# Patient Record
Sex: Male | Born: 1970
Health system: Southern US, Community
[De-identification: ages and names within clinical notes are randomized; demographics above are authoritative.]

## PROBLEM LIST (undated history)

## (undated) DIAGNOSIS — I10 Essential (primary) hypertension: Secondary | ICD-10-CM

## (undated) DIAGNOSIS — J302 Other seasonal allergic rhinitis: Secondary | ICD-10-CM

## (undated) DIAGNOSIS — F430 Acute stress reaction: Secondary | ICD-10-CM

## (undated) DIAGNOSIS — G4700 Insomnia, unspecified: Secondary | ICD-10-CM

## (undated) DIAGNOSIS — Z8619 Personal history of other infectious and parasitic diseases: Secondary | ICD-10-CM

## (undated) HISTORY — DX: Acute stress reaction: F43.0

## (undated) HISTORY — PX: HERNIA REPAIR: SHX51

## (undated) HISTORY — DX: Personal history of other infectious and parasitic diseases: Z86.19

---

## 2006-05-29 DIAGNOSIS — Z8249 Family history of ischemic heart disease and other diseases of the circulatory system: Secondary | ICD-10-CM | POA: Insufficient documentation

## 2006-07-19 DIAGNOSIS — F430 Acute stress reaction: Secondary | ICD-10-CM | POA: Insufficient documentation

## 2012-10-16 LAB — TSH: TSH: 2.51 u[IU]/mL (ref 0.41–5.90)

## 2012-11-15 LAB — BASIC METABOLIC PANEL
BUN: 15 mg/dL (ref 4–21)
CREATININE: 1.1 mg/dL (ref 0.6–1.3)
Glucose: 91 mg/dL
Potassium: 4.4 mmol/L (ref 3.4–5.3)
Sodium: 141 mmol/L (ref 137–147)

## 2013-05-14 HISTORY — PX: WISDOM TOOTH EXTRACTION: SHX21

## 2013-06-04 ENCOUNTER — Inpatient Hospital Stay (HOSPITAL_COMMUNITY)
Admission: AD | Admit: 2013-06-04 | Discharge: 2013-06-06 | DRG: 065 | Disposition: A | Discharge status: Home / Self Care | Payer: 59 | Source: Other Acute Inpatient Hospital | Attending: Family Medicine | Admitting: Family Medicine

## 2013-06-04 ENCOUNTER — Inpatient Hospital Stay: Payer: Self-pay | Admitting: Internal Medicine

## 2013-06-04 ENCOUNTER — Inpatient Hospital Stay (HOSPITAL_COMMUNITY): Payer: 59

## 2013-06-04 ENCOUNTER — Encounter (HOSPITAL_COMMUNITY): Payer: Self-pay | Admitting: *Deleted

## 2013-06-04 DIAGNOSIS — R739 Hyperglycemia, unspecified: Secondary | ICD-10-CM | POA: Diagnosis present

## 2013-06-04 DIAGNOSIS — G4700 Insomnia, unspecified: Secondary | ICD-10-CM | POA: Diagnosis present

## 2013-06-04 DIAGNOSIS — R7309 Other abnormal glucose: Secondary | ICD-10-CM | POA: Diagnosis present

## 2013-06-04 DIAGNOSIS — Z79899 Other long term (current) drug therapy: Secondary | ICD-10-CM

## 2013-06-04 DIAGNOSIS — R279 Unspecified lack of coordination: Secondary | ICD-10-CM | POA: Diagnosis present

## 2013-06-04 DIAGNOSIS — J309 Allergic rhinitis, unspecified: Secondary | ICD-10-CM | POA: Diagnosis present

## 2013-06-04 DIAGNOSIS — I6350 Cerebral infarction due to unspecified occlusion or stenosis of unspecified cerebral artery: Principal | ICD-10-CM | POA: Diagnosis present

## 2013-06-04 DIAGNOSIS — Q2111 Secundum atrial septal defect: Secondary | ICD-10-CM

## 2013-06-04 DIAGNOSIS — I639 Cerebral infarction, unspecified: Secondary | ICD-10-CM

## 2013-06-04 DIAGNOSIS — Z8673 Personal history of transient ischemic attack (TIA), and cerebral infarction without residual deficits: Secondary | ICD-10-CM | POA: Diagnosis present

## 2013-06-04 DIAGNOSIS — Z823 Family history of stroke: Secondary | ICD-10-CM

## 2013-06-04 DIAGNOSIS — Q211 Atrial septal defect: Secondary | ICD-10-CM

## 2013-06-04 DIAGNOSIS — I10 Essential (primary) hypertension: Secondary | ICD-10-CM | POA: Diagnosis present

## 2013-06-04 HISTORY — DX: Insomnia, unspecified: G47.00

## 2013-06-04 HISTORY — DX: Essential (primary) hypertension: I10

## 2013-06-04 HISTORY — DX: Other seasonal allergic rhinitis: J30.2

## 2013-06-04 LAB — COMPREHENSIVE METABOLIC PANEL
ALK PHOS: 55 U/L
ALT: 26 U/L (ref 12–78)
ANION GAP: 6 — AB (ref 7–16)
Albumin: 4.2 g/dL (ref 3.4–5.0)
BUN: 22 mg/dL — ABNORMAL HIGH (ref 7–18)
Bilirubin,Total: 0.4 mg/dL (ref 0.2–1.0)
Calcium, Total: 8.6 mg/dL (ref 8.5–10.1)
Chloride: 105 mmol/L (ref 98–107)
Co2: 27 mmol/L (ref 21–32)
Creatinine: 1.17 mg/dL (ref 0.60–1.30)
EGFR (African American): >60
GLUCOSE: 190 mg/dL — AB (ref 65–99)
OSMOLALITY: 284 (ref 275–301)
Potassium: 3.6 mmol/L (ref 3.5–5.1)
SGOT(AST): 19 U/L (ref 15–37)
SODIUM: 138 mmol/L (ref 136–145)
TOTAL PROTEIN: 7.4 g/dL (ref 6.4–8.2)

## 2013-06-04 LAB — CBC
HCT: 44.9 % (ref 40.0–52.0)
HGB: 15.5 g/dL (ref 13.0–18.0)
MCH: 30.5 pg (ref 26.0–34.0)
MCHC: 34.6 g/dL (ref 32.0–36.0)
MCV: 88 fL (ref 80–100)
PLATELETS: 263 10*3/uL (ref 150–440)
RBC: 5.08 10*6/uL (ref 4.40–5.90)
RDW: 13.4 % (ref 11.5–14.5)
WBC: 8.8 10*3/uL (ref 3.8–10.6)

## 2013-06-04 LAB — URINALYSIS, COMPLETE
Bacteria: NONE SEEN
Bilirubin,UR: NEGATIVE
Blood: NEGATIVE
Glucose,UR: 50 mg/dL (ref 0–75)
Leukocyte Esterase: NEGATIVE
Nitrite: NEGATIVE
PH: 7 (ref 4.5–8.0)
Protein: NEGATIVE
RBC,UR: NONE SEEN /HPF (ref 0–5)
SQUAMOUS EPITHELIAL: NONE SEEN
Specific Gravity: 1.018 (ref 1.003–1.030)
WBC UR: 2 /HPF (ref 0–5)

## 2013-06-04 LAB — TROPONIN I: Troponin-I: <0.02 ng/mL

## 2013-06-04 LAB — TSH: Thyroid Stimulating Horm: 1.13 u[IU]/mL

## 2013-06-04 LAB — CK-MB: CK-MB: 1.3 ng/mL (ref 0.5–3.6)

## 2013-06-04 MED ORDER — ATORVASTATIN CALCIUM 40 MG PO TABS
40.0000 mg | ORAL_TABLET | Freq: Every day | ORAL | Status: DC
  Administered 2013-06-05: 40 mg via ORAL
  Filled 2013-06-04 (×3): qty 1

## 2013-06-04 MED ORDER — ASPIRIN 325 MG PO TABS
325.0000 mg | ORAL_TABLET | Freq: Every day | ORAL | Status: DC
  Administered 2013-06-05 – 2013-06-06 (×2): 325 mg via ORAL
  Filled 2013-06-04 (×2): qty 1

## 2013-06-04 MED ORDER — LISINOPRIL 10 MG PO TABS
10.0000 mg | ORAL_TABLET | Freq: Every day | ORAL | Status: DC
  Administered 2013-06-05: 10 mg via ORAL
  Filled 2013-06-04: qty 1

## 2013-06-04 MED ORDER — ZOLPIDEM TARTRATE 5 MG PO TABS: 10.0000 mg | ORAL_TABLET | Freq: Every evening | ORAL | Status: DC | PRN

## 2013-06-04 NOTE — H&P (Signed)
[facility] History and Physical  Preston DuvalJeffrey W Nguyen GNF:[id number] DOB: 03/08/1970 DOA: 06/04/2013  Referring physician: Dr. Allena KatzPatel [facility] PCP: No primary provider on file. Dr. Mila Merryonald Fisher  Chief Complaint: Nausea, vomiting, gait instability, abnormal balance  HPI: Preston Nguyen is a 43 y.o. male. He has hypertension controlled on lisinopril. Also has insomnia treated with Ambien at bedtime. Presumed seasonal allergic rhinitis on Allegra.  The patient had a extraction of a wisdom tooth approximately 2 weeks ago. Subsequently he was taking ibuprofen in addition to a hydrocodone-acetaminophen preparation. Overall he was recovering from the extraction in terms of his dental pain; he awoke this morning and had breakfast and said he felt good. Subsequently he developed the very acute onset of dizziness and gait instability. He was stumbling to walk and favoring his left side. He felt generalized dizziness is not described as room spinning around her inability to keep his balance. He had significant nausea and vomiting times many episodes.  In terms of the patient's other symptoms he says his bowel function and urinary function is normal. He denies chest pain or shortness of breath. His insomnia is chronic. He does report significant stress at work and questions whether that contributes to his risk of stroke.  Because of this he was evaluated at [facility] with initial concern for vertigo. OSH records reviewed and summarized: He underwent CT scan of the head which was unremarkable but to do a suspicion for intracranial pathology patient underwent MRI of the brain which did show acute cerebellar infarction. The patient has family preferred definitive neurologic assessment which [facility]  Review of Systems:  Review of Systems - History obtained from the patient's wife and the patient General ROS: positive for  -  malaise negative for - chills or fatigue Psychological ROS: negative Endocrine ROS: negative Respiratory ROS: no cough, shortness of breath, or wheezing Cardiovascular ROS: no chest pain or dyspnea on exertion Gastrointestinal ROS: no abdominal pain, change in bowel habits, or black or bloody stools Genito-Urinary ROS: no dysuria, trouble voiding, or hematuria Musculoskeletal ROS: negative Neurological ROS: positive for - dizziness, impaired coordination/balance and weakness negative for - behavioral changes, bowel and bladder control changes, memory loss, numbness/tingling, seizures, speech problems or tremors The remainder of a complete review of systems was reviewed and was negative.  Past Medical History  Diagnosis Date  . Hypertension   . Seasonal allergic rhinitis   . Insomnia    Past Surgical History  Procedure Laterality Date  . Wisdom tooth extraction  05/2013   Social History:  reports that he has never smoked. He does not have any smokeless tobacco history on file. He reports that he drinks alcohol. He reports that he does not use illicit drugs.  No Known Allergies  Family History  Problem Relation Age of Onset  . Stroke Mother     aged 6150s  . Stroke Maternal Grandmother      Prior to Admission medications   Medication Sig Start Date End Date Taking? Authorizing Provider  fexofenadine (ALLEGRA) 180 MG tablet Take by mouth daily. Dose unkn   Yes Historical Provider, MD  HYDROcodone-acetaminophen (NORCO/VICODIN) 5-325 MG per tablet Take 2 tablets by mouth every 6 (six) hours as needed for moderate pain.   Yes Historical Provider, MD  lisinopril (PRINIVIL,ZESTRIL) 10 MG tablet Take 10 mg by mouth daily.   Yes Historical Provider, MD  zolpidem (AMBIEN) 10 MG tablet Take 10 mg by mouth  at bedtime.   Yes Historical Provider, MD    Physical Exam: Filed Vitals:   06/04/13 2200  BP: 152/92  Pulse: 97  Temp: 98.4 F (36.9 C)  Resp: 18   BP 152/92  Pulse 97   Temp(Src) 98.4 F (36.9 C) (Oral)  Resp 18  Ht 6\' 1"  (1.854 m)  Wt 105.1 kg (231 lb 11.3 oz)  BMI 30.58 kg/m2  SpO2 99%  General: Alert, oriented to self, place, and time. HEENT: Gaze is conjugate.  No nasal deformities.  No nasal discharge noted.  Mucus membranes are well hydrated.  Tongue protrudes midline. Cardiovascular: Tachycardic to low 100s on exam rate with regular rhythm.  Normal S1 and S2.  No rubs or gallop sounds.  No murmur.  No jugular venous distention noted.  Pulmonary: Good aeration to the bilateral bases.  No adventitious sounds auscultated. Abdominal: Soft, without distention.  Non-tender throughout.  Bowel sounds are normoactive.   GU: Rectal exam deferred. MSK: Extremities without cyanosis and with good capillary refill.  No edema bilaterally. Skin: Without icterus, rash, or other lesions.  No significant wounds. Neuropsychiatric: Good attention.  No obvious motor deficits.  Good insight into situation and disease process.  Capable of making medical decisions. Physical Exam  Constitutional: He is oriented to person, place, and time.  Neurological: He is alert and oriented to person, place, and time. He displays no atrophy and no tremor. No cranial nerve deficit or sensory deficit. He exhibits normal muscle tone. He displays no seizure activity. Coordination and gait abnormal. GCS eye subscore is 4. GCS verbal subscore is 5. GCS motor subscore is 6.  On standing, the patient has significant difficulty maintaining balance.  He is observed to sway erratically to the left side, occasionally needing assistance to avoid falls.       Labs on Admission:  From outside hospital White count 8.8, hemoglobin 15.5, platelet 263 Sodium 138, potassium 3.6, chloride 105, CO2 27, glucose 190.   Radiological Exams on Admission: Dg Chest 2 View  06/04/2013   CLINICAL DATA:  Weakness, dizziness, vomiting. Cough. Retrocardiac opacity seen on OSH plain film.  EXAM: CHEST  2 VIEW   COMPARISON:  DG CHEST 1V PORT dated 06/04/2013  FINDINGS: The heart size and mediastinal contours are within normal limits. Both lungs are clear. The visualized skeletal structures are unremarkable.  IMPRESSION: No active cardiopulmonary disease.   Electronically Signed   By: Burman Nieves M.D.   On: 06/04/2013 23:02    EKG: Independently reviewed. Outside hospital EKG. Ventricular rate 94 normal sinus rhythm. Possible left atrial enlargement. QTC prolonged 497 ms.  Outside hospital radiology (reports, no images available) CT head negative MRI brain showing acute left cerebellar infarction and slowing of the left vertebral flow considered in the setting of acute infarction  Assessment/Plan Active Problems:   Cerebellar infarction  (hospital diagnoses bolded)  1. Acute cerebellar infarction.  Patient had MRI at the outside hospital which showed acute left cerebellar infarction and evidence of swelling of the left vertebral flow possibly in relation to the infarction. The patient has artery undergone the start of an appropriate workup including telemetry monitoring, EKG which shows sinus rhythm, carotid duplex which shows mild bilateral plaques.  The patient does have risk factors of family history of early stroke, hypertension.  Obtain neurologic consultation: Discussed the case with Dr. Thad Ranger via phone at 11:30 PM.  Continue aspirin for secondary prophylaxis started at the outside facility  Initiate high-dose statin  Telemetry monitoring  We'll obtain a TTE with bubble study  Patient may benefit from a MR angiogram but I will defer to neurology on the appropriate next imaging tests, if any.  Physical, occupational, speech therapy per stroke pathway  Attention to hypertension as below 2. Hypertension likely uncontrolled on lisinopril. Continue lisinopril for now; May need initiation of more intensive therapy if remaining hypertensive but do not want to drop the pressure overly  abruptly. 3. Hyperglycemia 190s. Possible stress response to acute illness with nausea vomiting but the patient does seem to have a family history of diabetes. We will obtain a hemoglobin A1c to evaluate for diabetes.  CBG monitoring. 4. Outside hospital opacity in the left lower lobe was likely spurious; obtain a repeat chest x-ray which was read as negative here. Patient has few respiratory symptoms which do not seem to be significant to his presentation at this time. 5. Insomnia: Allow Ambien when necessary 6. Recent dental extraction with oral pain: Allow acetaminophen hydrocodone preparation as needed  Neurology formally consulted by me on admission.  Code Status: Full  Family Communication: Discussed plan of care with wife at bedside  Disposition Plan: Admit to inpt tele with neuro c/s   Time spent: 75 minutes  Jacqulyn DuckingMichael W [facility] Pager [phone]

## 2013-06-05 ENCOUNTER — Inpatient Hospital Stay (HOSPITAL_COMMUNITY): Payer: 59

## 2013-06-05 ENCOUNTER — Encounter (HOSPITAL_COMMUNITY): Payer: Self-pay | Admitting: Radiology

## 2013-06-05 DIAGNOSIS — Z8673 Personal history of transient ischemic attack (TIA), and cerebral infarction without residual deficits: Secondary | ICD-10-CM | POA: Diagnosis present

## 2013-06-05 LAB — GLUCOSE, CAPILLARY
GLUCOSE-CAPILLARY: 162 mg/dL — AB (ref 70–99)
Glucose-Capillary: 139 mg/dL — ABNORMAL HIGH (ref 70–99)
Glucose-Capillary: 116 mg/dL — ABNORMAL HIGH (ref 70–99)
Glucose-Capillary: 115 mg/dL — ABNORMAL HIGH (ref 70–99)

## 2013-06-05 LAB — BASIC METABOLIC PANEL
BUN: 19 mg/dL (ref 6–23)
CALCIUM: 8.8 mg/dL (ref 8.4–10.5)
CO2: 21 meq/L (ref 19–32)
Chloride: 104 mEq/L (ref 96–112)
Creatinine, Ser: 1.01 mg/dL (ref 0.50–1.35)
GFR calc Af Amer: >90 mL/min (ref >90)
GFR calc non Af Amer: 89 mL/min — ABNORMAL LOW (ref >90)
GLUCOSE: 141 mg/dL — AB (ref 70–99)
Potassium: 3.7 mEq/L (ref 3.7–5.3)
SODIUM: 142 meq/L (ref 137–147)

## 2013-06-05 LAB — LIPID PANEL
CHOL/HDL RATIO: 3.7 ratio
Cholesterol: 149 mg/dL (ref 0–200)
HDL: 40 mg/dL (ref >39)
LDL Cholesterol: 78 mg/dL (ref 0–99)
TRIGLYCERIDES: 155 mg/dL — AB (ref <150)
VLDL: 31 mg/dL (ref 0–40)

## 2013-06-05 LAB — CBC
HCT: 39.3 % (ref 39.0–52.0)
HEMOGLOBIN: 13.7 g/dL (ref 13.0–17.0)
MCH: 30.4 pg (ref 26.0–34.0)
MCHC: 34.9 g/dL (ref 30.0–36.0)
MCV: 87.1 fL (ref 78.0–100.0)
Platelets: 310 10*3/uL (ref 150–400)
RBC: 4.51 MIL/uL (ref 4.22–5.81)
RDW: 12.5 % (ref 11.5–15.5)
WBC: 13.5 10*3/uL — ABNORMAL HIGH (ref 4.0–10.5)

## 2013-06-05 LAB — RPR

## 2013-06-05 LAB — HEMOGLOBIN A1C
Hgb A1c MFr Bld: 6 % — ABNORMAL HIGH (ref <5.7)
Mean Plasma Glucose: 126 mg/dL — ABNORMAL HIGH (ref <117)

## 2013-06-05 LAB — SEDIMENTATION RATE: SED RATE: 1 mm/h (ref 0–16)

## 2013-06-05 LAB — TSH: TSH: 1.55 u[IU]/mL (ref 0.350–4.500)

## 2013-06-05 MED ORDER — ENOXAPARIN SODIUM 40 MG/0.4ML ~~LOC~~ SOLN
40.0000 mg | Freq: Every day | SUBCUTANEOUS | Status: DC
  Administered 2013-06-05 – 2013-06-06 (×2): 40 mg via SUBCUTANEOUS
  Filled 2013-06-05 (×2): qty 0.4

## 2013-06-05 MED ORDER — STROKE: EARLY STAGES OF RECOVERY BOOK
Freq: Once | Status: DC
  Filled 2013-06-05: qty 1

## 2013-06-05 MED ORDER — IOHEXOL 350 MG/ML SOLN
50.0000 mL | Freq: Once | INTRAVENOUS | Status: AC | PRN
  Administered 2013-06-05: 50 mL via INTRAVENOUS

## 2013-06-05 NOTE — Progress Notes (Signed)
[facility] PROGRESS NOTE  Preston Nguyen WUJ:[id number] DOB: 07-22-1970 DOA: 06/04/2013 PCP: No primary provider on file.  Assessment/Plan:  Acute cerebellar infarction.  Patient had MRI at the outside hospital which showed acute left cerebellar infarction and evidence of swelling of the left vertebral flow possibly in relation to the infarction. The patient has artery undergone the start of an appropriate workup including telemetry monitoring, EKG which shows sinus rhythm, carotid duplex which shows mild bilateral plaques. The patient does have risk factors of family history of early stroke, hypertension. Patient was seen by neurology and plan is for TEE in am. Hypercoagulable and vasculitis panel. Continue with aspirin 325 mg po daily.  Hypertension BP is well controlled, will hold the lisinopril for permissive hypertension  Hyperglycemia Blood glucose has been elevated. Hb a1c is pending    Code Status: Full code Family Communication:  Discussed with wife at the bedside Disposition Plan: Home when stable   Consultants:  Neurology   Procedures:  Carotid duplex  TEE  Antibiotics:  None  HPI/Subjective: 43 y.o. male.  has hypertension controlled on lisinopril. The patient had a extraction of a wisdom tooth approximately 2 weeks ago. Subsequently he was taking ibuprofen in addition to a hydrocodone-acetaminophen preparation. Overall he was recovering from the extraction in terms of his dental pain; he awoke this morning and had breakfast and said he felt good. Subsequently he developed the very acute onset of dizziness and gait instability. He was stumbling to walk and favoring his left side. He felt generalized dizziness is not described as room spinning around her inability to keep his balance. He had significant nausea and vomiting times many episodes. In terms of the patient's other symptoms he says his bowel function and urinary function is normal. He denies  chest pain or shortness of breath. His insomnia is chronic. He does report significant stress at work and questions whether that contributes to his risk of stroke.  Because of this he was evaluated at [facility] with initial concern for vertigo. OSH records reviewed and summarized: He underwent CT scan of the head which was unremarkable but to do a suspicion for intracranial pathology patient underwent MRI of the brain which did show acute cerebellar infarction. The patient has family preferred definitive neurologic assessment which led him to referral to [facility] for [facility].    Today patient still feels dizzy on walking, but says that he has improved since yesterday    Objective: Filed Vitals:   06/05/13 1433  BP: 139/86  Pulse: 92  Temp: 97.7 F (36.5 C)  Resp: 18    Intake/Output Summary (Last 24 hours) at 06/05/13 1542 Last data filed at 06/05/13 1300  Gross per 24 hour  Intake    400 ml  Output      0 ml  Net    400 ml   Filed Weights   06/04/13 2200  Weight: 105.1 kg (231 lb 11.3 oz)    Exam:  Physical Exam: Head: Normocephalic, atraumatic.  Eyes: No signs of jaundice, EOMI Nose: Mucous membranes dry.  Throat: Oropharynx nonerythematous, no exudate appreciated.  Neck: supple,No deformities, masses, or tenderness noted. Lungs: Normal respiratory effort. B/L Clear to auscultation, no crackles or wheezes.  Heart: Regular RR. S1 and S2 normal  Abdomen: BS normoactive. Soft, Nondistended, non-tender.  Extremities: No pretibial edema, no erythema   Data Reviewed: Basic Metabolic Panel:  Recent Labs Lab 06/05/13 0012  NA 142  K 3.7  CL 104  CO2  21  GLUCOSE 141*  BUN 19  CREATININE 1.01  CALCIUM 8.8   Liver Function Tests: No results found for this basename: AST, ALT, ALKPHOS, BILITOT, PROT, ALBUMIN,  in the last 168 hours No results found for this basename: LIPASE, AMYLASE,  in the last 168 hours No results found for this basename:  AMMONIA,  in the last 168 hours CBC:  Recent Labs Lab 06/05/13 0012  WBC 13.5*  HGB 13.7  HCT 39.3  MCV 87.1  PLT 310   Cardiac Enzymes: No results found for this basename: CKTOTAL, CKMB, CKMBINDEX, TROPONINI,  in the last 168 hours BNP (last 3 results) No results found for this basename: PROBNP,  in the last 8760 hours CBG:  Recent Labs Lab 06/05/13 0641 06/05/13 1146  GLUCAP 139* 162*    No results found for this or any previous visit (from the past 240 hour(s)).   Studies: Ct Angio Head W/cm &/or Wo Cm  06/05/2013   CLINICAL DATA:  Gait imbalance, nausea, vomiting. Prior MRI shows acute left inferior cerebellar infarcts.  EXAM: CT ANGIOGRAPHY HEAD AND NECK  TECHNIQUE: Multidetector CT imaging of the head and neck was performed using the standard protocol during bolus administration of intravenous contrast. Multiplanar CT image reconstructions and MIPs were obtained to evaluate the vascular anatomy. Carotid stenosis measurements (when applicable) are obtained utilizing NASCET criteria, using the distal internal carotid diameter as the denominator.  CONTRAST:  50mL OMNIPAQUE IOHEXOL 350 MG/ML SOLN  COMPARISON:  Prior MRI from 06/04/2013.  FINDINGS: Focal hypodensities within the inferior aspect of the left cerebellar hemisphere again seen, compatible with previous identified acute ischemic infarcts within this region. No evidence of hemorrhagic conversion. No new large vessel territory infarct identified. No mass lesion, midline shift, or extra-axial fluid collection. No hydrocephalus.  The calvarium is intact. Orbits are normal. Paranasal sinuses and mastoid air cells are clear.  CTA HEAD FINDINGS  ANTERIOR CIRCULATION:  The petrous, cavernous, and supra clinoid segments of the internal carotid arteries are well opacified bilaterally without evidence of high-grade flow-limiting stenosis or other abnormality. Ophthalmic arteries are grossly normal. The A1 segments are well opacified and  symmetric bilaterally. Anterior communicating artery is normal. Anterior cerebral arteries are well well opacified bilaterally.  The middle cerebral arteries including the M1 segments and their distal branches are well opacified bilaterally. No high-grade proximal conclusion.  No aneurysm seen within the anterior circulation.  POSTERIOR CIRCULATION:  The left V4 segment is markedly diminutive as compared to the right but grossly patent. No definite high-grade flow-limiting stenosis. The posterior inferior cerebral arteries are opacified bilaterally. The vertebrobasilar junction is normal. The basilar artery is widely patent without evidence of high-grade stenosis or occlusion. The superior cerebellar arteries and anterior inferior cerebral arteries are well opacified bilaterally.  No aneurysm identified within the posterior circulation.  Review of the MIP images confirms the above findings.  CTA NECK FINDINGS  Visualized aortic arch is of normal caliber with normal 3 vessel morphology. No high-grade flow-limiting stenosis seen at the origin of the great vessels. Should Ebony Cargo arteries are widely patent bilaterally.  The common carotid arteries are well opacified bilaterally and of symmetric caliber. No evidence of high-grade flow-limiting stenosis or dissection. The carotid bifurcations are normal bilaterally. Minimal atherosclerotic plaque noted about the proximal left internal carotid artery. The internal carotid arteries are well opacified bilaterally without evidence of high-grade flow-limiting stenosis, occlusion, or dissection.  The external carotid arteries and their branches are within normal limits.  The vertebral  arteries arise from the subclavian arteries bilaterally. The right vertebral artery is dominant. The right vertebral artery is well opacified along its entire course without evidence of high-grade flow-limiting stenosis, occlusion, or dissection. The left vertebral artery is markedly diminutive  as compared to the right but is grossly patent to the level of the vertebrobasilar junction.  Review of the MIP images confirms the above findings.  Visualized soft tissues of the neck are within normal limits without evidence of mass lesion, adenopathy, or other acute abnormality. Multilevel degenerative changes and within the visualized spine. Visualized lung apices are clear.  IMPRESSION: CTA HEAD:  1. No significant interval change in size and distribution of ischemic infarcts involving the inferior left cerebellar hemisphere. No evidence of hemorrhagic conversion. 2. Diminutive but patent left vertebral artery to the level of the vertebrobasilar junction. The posterior inferior cerebral arteries are opacified bilaterally without evidence of occlusion or definite high-grade flow-limiting stenosis. 3. Dominant right vertebral artery. 4. No proximal branch occlusion or high-grade flow-limiting stenosis identified within the intracranial circulation. 5. No intracranial aneurysm. CTA NECK:  1. Normal CTA of the neck without evidence of high-grade flow-limiting stenosis, dissection, or occlusion. The left vertebral artery is diminutive along its entire course but grossly patent.   Electronically Signed   By: Rise MuBenjamin  McClintock M.D.   On: 06/05/2013 06:08   Dg Chest 2 View  06/04/2013   CLINICAL DATA:  Weakness, dizziness, vomiting. Cough. Retrocardiac opacity seen on OSH plain film.  EXAM: CHEST  2 VIEW  COMPARISON:  DG CHEST 1V PORT dated 06/04/2013  FINDINGS: The heart size and mediastinal contours are within normal limits. Both lungs are clear. The visualized skeletal structures are unremarkable.  IMPRESSION: No active cardiopulmonary disease.   Electronically Signed   By: Burman NievesWilliam  Stevens M.D.   On: 06/04/2013 23:02   Ct Angio Neck W/cm &/or Wo/cm  06/05/2013   CLINICAL DATA:  Gait imbalance, nausea, vomiting. Prior MRI shows acute left inferior cerebellar infarcts.  EXAM: CT ANGIOGRAPHY HEAD AND NECK   TECHNIQUE: Multidetector CT imaging of the head and neck was performed using the standard protocol during bolus administration of intravenous contrast. Multiplanar CT image reconstructions and MIPs were obtained to evaluate the vascular anatomy. Carotid stenosis measurements (when applicable) are obtained utilizing NASCET criteria, using the distal internal carotid diameter as the denominator.  CONTRAST:  50mL OMNIPAQUE IOHEXOL 350 MG/ML SOLN  COMPARISON:  Prior MRI from 06/04/2013.  FINDINGS: Focal hypodensities within the inferior aspect of the left cerebellar hemisphere again seen, compatible with previous identified acute ischemic infarcts within this region. No evidence of hemorrhagic conversion. No new large vessel territory infarct identified. No mass lesion, midline shift, or extra-axial fluid collection. No hydrocephalus.  The calvarium is intact. Orbits are normal. Paranasal sinuses and mastoid air cells are clear.  CTA HEAD FINDINGS  ANTERIOR CIRCULATION:  The petrous, cavernous, and supra clinoid segments of the internal carotid arteries are well opacified bilaterally without evidence of high-grade flow-limiting stenosis or other abnormality. Ophthalmic arteries are grossly normal. The A1 segments are well opacified and symmetric bilaterally. Anterior communicating artery is normal. Anterior cerebral arteries are well well opacified bilaterally.  The middle cerebral arteries including the M1 segments and their distal branches are well opacified bilaterally. No high-grade proximal conclusion.  No aneurysm seen within the anterior circulation.  POSTERIOR CIRCULATION:  The left V4 segment is markedly diminutive as compared to the right but grossly patent. No definite high-grade flow-limiting stenosis. The posterior inferior cerebral arteries  are opacified bilaterally. The vertebrobasilar junction is normal. The basilar artery is widely patent without evidence of high-grade stenosis or occlusion. The  superior cerebellar arteries and anterior inferior cerebral arteries are well opacified bilaterally.  No aneurysm identified within the posterior circulation.  Review of the MIP images confirms the above findings.  CTA NECK FINDINGS  Visualized aortic arch is of normal caliber with normal 3 vessel morphology. No high-grade flow-limiting stenosis seen at the origin of the great vessels. Should Ebony Cargo[place] arteries are widely patent bilaterally.  The common carotid arteries are well opacified bilaterally and of symmetric caliber. No evidence of high-grade flow-limiting stenosis or dissection. The carotid bifurcations are normal bilaterally. Minimal atherosclerotic plaque noted about the proximal left internal carotid artery. The internal carotid arteries are well opacified bilaterally without evidence of high-grade flow-limiting stenosis, occlusion, or dissection.  The external carotid arteries and their branches are within normal limits.  The vertebral arteries arise from the subclavian arteries bilaterally. The right vertebral artery is dominant. The right vertebral artery is well opacified along its entire course without evidence of high-grade flow-limiting stenosis, occlusion, or dissection. The left vertebral artery is markedly diminutive as compared to the right but is grossly patent to the level of the vertebrobasilar junction.  Review of the MIP images confirms the above findings.  Visualized soft tissues of the neck are within normal limits without evidence of mass lesion, adenopathy, or other acute abnormality. Multilevel degenerative changes and within the visualized spine. Visualized lung apices are clear.  IMPRESSION: CTA HEAD:  1. No significant interval change in size and distribution of ischemic infarcts involving the inferior left cerebellar hemisphere. No evidence of hemorrhagic conversion. 2. Diminutive but patent left vertebral artery to the level of the vertebrobasilar junction. The posterior inferior  cerebral arteries are opacified bilaterally without evidence of occlusion or definite high-grade flow-limiting stenosis. 3. Dominant right vertebral artery. 4. No proximal branch occlusion or high-grade flow-limiting stenosis identified within the intracranial circulation. 5. No intracranial aneurysm. CTA NECK:  1. Normal CTA of the neck without evidence of high-grade flow-limiting stenosis, dissection, or occlusion. The left vertebral artery is diminutive along its entire course but grossly patent.   Electronically Signed   By: Rise MuBenjamin  McClintock M.D.   On: 06/05/2013 08:17    Scheduled Meds: .  stroke: mapping our early stages of recovery book   Does not apply Once  . aspirin  325 mg Oral Daily  . atorvastatin  40 mg Oral q1800  . enoxaparin (LOVENOX) injection  40 mg Subcutaneous Daily  . lisinopril  10 mg Oral Daily   Continuous Infusions:   Principal Problem:   Cerebellar infarction Active Problems:   Essential hypertension, benign   Hyperglycemia   CVA (cerebral infarction)    Time spent: 25 min    Meredeth IdeGagan S Lama  [facility] Pager [phone] If 7PM-7AM, please contact night-coverage at [email or web address], password [email or web address] 06/05/2013, 3:42 PM  LOS: 1 day

## 2013-06-05 NOTE — Evaluation (Signed)
Physical Therapy Evaluation Patient Details Name: Preston DuvalJeffrey W Nguyen MRN: [id number] DOB: 06/15/1970 Today's Date: 06/05/2013   History of Present Illness  Admitted after sudden very acute onset of dizziness and gait instability that progressed to N/V, MRI showed L cerebellar infarct.  Clinical Impression  Pt admitted with/for dizziness and gait disturbance.  Pt currently limited functionally due to the problems listed below.  (see problems list.)  Pt will benefit from PT to maximize function and safety to be able to get home safely with limited available assist .     Follow Up Recommendations Outpatient PT    Equipment Recommendations  None recommended by PT    Recommendations for Other Services       Precautions / Restrictions Precautions Precautions: Fall      Mobility  Bed Mobility Overal bed mobility: Modified Independent (not quite normal fluidity of movement)                Transfers Overall transfer level: Needs assistance Equipment used: None Transfers: Sit to/from Stand Sit to Stand: Supervision         General transfer comment: generally fluid and normalized movement  Ambulation/Gait Ambulation/Gait assistance: Supervision Ambulation Distance (Feet): 400 Feet Assistive device: None Gait Pattern/deviations: Step-through pattern   Gait velocity interpretation: at or above normal speed for age/gender General Gait Details: generally steady with instability only at higher levels of balance challenges.  Stairs Stairs: Yes Stairs assistance: Supervision Stair Management: One rail Right;Alternating pattern;Forwards Number of Stairs: 6 General stair comments: fluid movement, but with rail assist  Wheelchair Mobility    Modified Rankin (Stroke Patients Only) Modified Rankin (Stroke Patients Only) Pre-Morbid Rankin Score: No symptoms Modified Rankin: Moderate disability     Balance Overall balance assessment: Needs assistance Sitting-balance  support: Feet supported;No upper extremity supported Sitting balance-Leahy Scale: Normal Sitting balance - Comments: determined by rhythmic stabilization   Standing balance support: No upper extremity supported Standing balance-Leahy Scale: Fair               High level balance activites: Backward walking;Direction changes;Turns;Sudden stops;Head turns High Level Balance Comments: No LOB with higher level balance challenges, but on several occasions linking one challenge to another ie walking forward turning and walking backward while scanning behind   produced some instability.             Pertinent Vitals/Pain     Home Living Family/patient expects to be discharged to:: Private residence Living Arrangements: Spouse/significant other;Other (Comment) 66(14 y/o son) Available Help at Discharge: Available PRN/intermittently;Other (Comment) (wife works as Runner, broadcasting/film/videoteacher and son at school days) Type of Home: House Home Access: Stairs to enter Entrance Stairs-Rails: Doctor, general practiceight;Left Entrance Stairs-Number of Steps: 5 (in garage) Home Layout: One level Home Equipment: None      Prior Function Level of Independence: Independent         Comments: 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)     Hand Dominance   Dominant Hand: Right    Extremity/Trunk Assessment   Upper Extremity Assessment: Defer to OT evaluation (Bil weak grips o/w strength WFL)           Lower Extremity Assessment: Overall WFL for tasks assessed (MMT did not show ataxia, dysmetria or diadodys.)      Cervical / Trunk Assessment: Normal  Communication   Communication: No difficulties  Cognition Arousal/Alertness: Awake/alert Behavior During Therapy: WFL for tasks assessed/performed Overall Cognitive Status: Within Functional Limits for tasks assessed  General Comments      Exercises        Assessment/Plan    PT Assessment Patient needs  continued PT services  PT Diagnosis  (decreased balance and coordination)   PT Problem List Decreased balance;Decreased coordination;Decreased mobility  PT Treatment Interventions Gait training;Stair training;Functional mobility training;Therapeutic activities;Patient/family education   PT Goals (Current goals can be found in the Care Plan section) Acute Rehab PT Goals Patient Stated Goal: back to work PT Goal Formulation: With patient Time For Goal Achievement: 06/12/13 Potential to Achieve Goals: Good    Frequency Min 3X/week   Barriers to discharge Decreased caregiver support;Other (comment) (Everyone works days)      Co-evaluation               End of Session   Activity Tolerance: Patient tolerated treatment well Patient left: with call bell/phone within reach;with family/visitor present (sitting EOB) Nurse Communication: Mobility status         Time: [id number] PT Time Calculation (min): 30 min   Charges:   PT Evaluation $Initial PT Evaluation Tier I: 1 Procedure PT Treatments $Gait Training: 8-22 mins $Therapeutic Activity: 8-22 mins   PT G CodesEliseo Nguyen:          Preston Nguyen 06/05/2013, 1:13 PM 06/05/2013   BingKen Damiel Nguyen, PT [phone]  (pager)

## 2013-06-05 NOTE — Care Management Note (Signed)
    Page 1 of 1   06/06/2013     3:27:18 PM CARE MANAGEMENT NOTE 06/06/2013  Patient:  Preston Nguyen, Preston Nguyen   Account Number:  [id number]  Date Initiated:  06/05/2013  Documentation initiated by:  Lorne Skeens  Subjective/Objective Assessment:   Patient admitted as transfer from [facility] for CVA. Lives at home with his wife.     Action/Plan:   Will follow for discharge needs pending PT/OT evals   Anticipated DC Date:     Anticipated DC Plan:  [place]  CM consult      Choice offered to / List presented to:             Status of service:  Completed, signed off Medicare Important Message given?   (If response is "NO", the following Medicare IM given date fields will be blank) Date Medicare IM given:   Date Additional Medicare IM given:    Discharge Disposition:    Per UR Regulation:  Reviewed for med. necessity/level of care/duration of stay  If discussed at [place] of Stay Meetings, dates discussed:    Comments:  06/06/13 [place], MSN, CM- CM was given a paper script for outpatient PT to provide to patient.  CM met with patient and wife to discuss arranging therapy at a location most convenient to their home that accepts their insurance.  Patient's wife states that she is comfortable with the process of scheduling an appointment.    06/05/13 [place], MSN, CM- Met with patient and wife regarding self-pay status and PCP.  Patient states that he DOES have a PCP: [facility]. Patient also states that he has a [employer].  Wife will bring the insurance card, which CM will fax to admitting.

## 2013-06-05 NOTE — Consult Note (Signed)
Referring Physician: Milks    Chief Complaint: N/V, gait instability  HPI: Preston Nguyen is an 43 y.o. male who reports that he awakened normal on Wednesday.  Just prior to leaving te house he had acute onset of gait imbalance. He felt as if he was falling to the left.  He went to lay down but felt no better and he then acutely developed severe nausea and vomiting.  This did not get better and EMS was called for the patient to be brought in for evaluation. Patient was initially seen at [facility]. Was sent here for further stroke evaluation.    Date last known well: Date: 06/04/2013 Time last known well: Time: 07:15 tPA Given: No: Outside time window, initially not felt to be a stroke, low NIHSS  Past Medical History  Diagnosis Date  . Hypertension   . Seasonal allergic rhinitis   . Insomnia     Past Surgical History  Procedure Laterality Date  . Wisdom tooth extraction  05/2013    Family History  Problem Relation Age of Onset  . Stroke Mother     aged 5250s  . Stroke Maternal Grandmother    Social History:  reports that he has never smoked. He does not have any smokeless tobacco history on file. He reports that he drinks alcohol. He reports that he does not use illicit drugs.  Allergies: No Known Allergies  Medications:  I have reviewed the patient's current medications. Prior to Admission:  Prescriptions prior to admission  Medication Sig Dispense Refill  . fexofenadine (ALLEGRA) 180 MG tablet Take by mouth daily. Dose unkn      . HYDROcodone-acetaminophen (NORCO/VICODIN) 5-325 MG per tablet Take 2 tablets by mouth every 6 (six) hours as needed for moderate pain.      Marland Kitchen. lisinopril (PRINIVIL,ZESTRIL) 10 MG tablet Take 10 mg by mouth daily.      Marland Kitchen. zolpidem (AMBIEN) 10 MG tablet Take 10 mg by mouth at bedtime.       Scheduled: . aspirin  325 mg Oral Daily  . atorvastatin  40 mg Oral q1800  . enoxaparin (LOVENOX) injection  40 mg Subcutaneous Q24H  . lisinopril  10 mg  Oral Daily    ROS: History obtained from the patient  General ROS: negative for - chills, fatigue, fever, night sweats, weight gain or weight loss Psychological ROS: negative for - behavioral disorder, hallucinations, memory difficulties, mood swings or suicidal ideation Ophthalmic ROS: negative for - blurry vision, double vision, eye pain or loss of vision ENT ROS: recent wisdom tooth extraction Allergy and Immunology ROS: negative for - hives or itchy/watery eyes Hematological and Lymphatic ROS: negative for - bleeding problems, bruising or swollen lymph nodes Endocrine ROS: negative for - galactorrhea, hair pattern changes, polydipsia/polyuria or temperature intolerance Respiratory ROS: negative for - cough, hemoptysis, shortness of breath or wheezing Cardiovascular ROS: negative for - chest pain, dyspnea on exertion, edema or irregular heartbeat Gastrointestinal ROS: as noted in HPI Genito-Urinary ROS: negative for - dysuria, hematuria, incontinence or urinary frequency/urgency Musculoskeletal ROS: negative for - joint swelling or muscular weakness Neurological ROS: as noted in HPI Dermatological ROS: negative for rash and skin lesion changes  Physical Examination: Blood pressure 145/83, pulse 98, temperature 97.9 F (36.6 C), temperature source Oral, resp. rate 18, height 6\' 1"  (1.854 m), weight 105.1 kg (231 lb 11.3 oz), SpO2 100.00%.  Neurologic Examination: Mental Status: Alert, oriented, thought content appropriate.  Speech fluent without evidence of aphasia.  Able to follow 3  step commands without difficulty. Cranial Nerves: II: Discs flat bilaterally; Visual fields grossly normal, pupils equal, round, reactive to light and accommodation III,IV, VI: ptosis not present, extra-ocular motions intact bilaterally V,VII: smile symmetric, facial light touch sensation normal bilaterally VIII: hearing normal bilaterally IX,X: gag reflex present XI: bilateral shoulder shrug XII:  midline tongue extension Motor: Right : Upper extremity   5/5 with pronator drift    Left:     Upper extremity   5/5  Lower extremity   5/5        Lower extremity   5/5 Tone and bulk:normal tone throughout; no atrophy noted Sensory: Pinprick and light touch intact throughout, bilaterally Deep Tendon Reflexes: 2+ and symmetric throughout Plantars: Right: downgoing   Left: downgoing Cerebellar: normal finger-to-nose and normal heel-to-shin test Gait: Unable to test safely CV: pulses palpable throughout     Laboratory Studies:  Basic Metabolic Panel: No results found for this basename: NA, K, CL, CO2, GLUCOSE, BUN, CREATININE, CALCIUM, MG, PHOS,  in the last 168 hours  Liver Function Tests: No results found for this basename: AST, ALT, ALKPHOS, BILITOT, PROT, ALBUMIN,  in the last 168 hours No results found for this basename: LIPASE, AMYLASE,  in the last 168 hours No results found for this basename: AMMONIA,  in the last 168 hours  CBC: No results found for this basename: WBC, NEUTROABS, HGB, HCT, MCV, PLT,  in the last 168 hours  Cardiac Enzymes: No results found for this basename: CKTOTAL, CKMB, CKMBINDEX, TROPONINI,  in the last 168 hours  BNP: No components found with this basename: POCBNP,   CBG: No results found for this basename: GLUCAP,  in the last 168 hours  Microbiology: No results found for this or any previous visit.  Coagulation Studies: No results found for this basename: LABPROT, INR,  in the last 72 hours  Urinalysis: No results found for this basename: COLORURINE, APPERANCEUR, LABSPEC, PHURINE, GLUCOSEU, HGBUR, BILIRUBINUR, KETONESUR, PROTEINUR, UROBILINOGEN, NITRITE, LEUKOCYTESUR,  in the last 168 hours  Lipid Panel: No results found for this basename: chol, trig, hdl, cholhdl, vldl, ldlcalc    HgbA1C:  No results found for this basename: HGBA1C    Urine Drug Screen:   No results found for this basename: labopia, cocainscrnur, labbenz, amphetmu,  thcu, labbarb    Alcohol Level: No results found for this basename: ETH,  in the last 168 hours  Other results: EKG: normal sinus rhythm at 94 bpm.  Imaging: Dg Chest 2 View  06/04/2013   CLINICAL DATA:  Weakness, dizziness, vomiting. Cough. Retrocardiac opacity seen on OSH plain film.  EXAM: CHEST  2 VIEW  COMPARISON:  DG CHEST 1V PORT dated 06/04/2013  FINDINGS: The heart size and mediastinal contours are within normal limits. Both lungs are clear. The visualized skeletal structures are unremarkable.  IMPRESSION: No active cardiopulmonary disease.   Electronically Signed   By: Burman NievesWilliam  Stevens M.D.   On: 06/04/2013 23:02    Assessment: 43 y.o. male presenting with gait imbalance and nausea/vomitng.  The patient no longer has the vomiting but continues to have difficulty with sitting unsupported and standing.  Head CT was performed at [facility] and has been reviewed.  It shows no acute changes.  With the patient not improving significantly an MRI of the brain was performed as well.  MRI of the brain from [place] reviewed and shows acute left inferior cerebellar infarcts and slow/occluded flow in the left vertebral.  Although the patient has hypertension, with the patient being so  young can not exclude further etiologies at this time.  Further work up recommended.    Stroke Risk Factors - hypertension  Plan: 1. HgbA1c, fasting lipid panel 2. CTA of the head and neck 3. PT consult, OT consult, Speech consult 4. Echocardiogram 5. Prophylactic therapy-Antiplatelet med: Aspirin - dose 325mg  daily 6. Risk factor modification 7. Telemetry monitoring 8. Frequent neuro checks   Thana Farr, MD [facility] [phone] 06/05/2013, 12:59 AM

## 2013-06-05 NOTE — Progress Notes (Signed)
Stroke Team Progress Note  HISTORY Preston Nguyen is an 43 y.o. male who reports that he awakened normal on Wednesday. Just prior to leaving te house he had acute onset of gait imbalance. He felt as if he was falling to the left. He went to lay down but felt no better and he then acutely developed severe nausea and vomiting. This did not get better and EMS was called for the patient to be brought in for evaluation. Patient was initially seen at [facility]. Was sent here for further stroke evaluation. last known well 06/04/2013 at 07:15  Patient was not administered TPA secondary to Outside time window, initially not felt to be a stroke, low NIHSS. He was admitted for further evaluation and treatment.  SUBJECTIVE His wife is at the bedside.  Overall he feels his condition is gradually improving.   OBJECTIVE Most recent Vital Signs: Filed Vitals:   06/05/13 0200 06/05/13 0400 06/05/13 0600 06/05/13 0820  BP: 147/80 142/85 148/87 139/92  Pulse: 100 99 103 93  Temp: 98 F (36.7 C) 98.4 F (36.9 C) 98 F (36.7 C) 98 F (36.7 C)  TempSrc: Oral Oral Oral Axillary  Resp: 18 18 20 18   Height:      Weight:      SpO2: 100% 100% 99% 99%   CBG (last 3)   Recent Labs  06/05/13 0641  GLUCAP 139*    IV Fluid Intake:     MEDICATIONS  .  stroke: mapping our early stages of recovery book   Does not apply Once  . aspirin  325 mg Oral Daily  . atorvastatin  40 mg Oral q1800  . enoxaparin (LOVENOX) injection  40 mg Subcutaneous Daily  . lisinopril  10 mg Oral Daily   PRN:  zolpidem  Diet:  Cardiac thin liquids Activity:   DVT Prophylaxis:  Lovenox 40 mg sq daily   CLINICALLY SIGNIFICANT STUDIES Basic Metabolic Panel:   Recent Labs Lab 06/05/13 0012  NA 142  K 3.7  CL 104  CO2 21  GLUCOSE 141*  BUN 19  CREATININE 1.01  CALCIUM 8.8   Liver Function Tests: No results found for this basename: AST, ALT, ALKPHOS, BILITOT, PROT, ALBUMIN,  in the last 168 hours CBC:   Recent  Labs Lab 06/05/13 0012  WBC 13.5*  HGB 13.7  HCT 39.3  MCV 87.1  PLT 310   Coagulation: No results found for this basename: LABPROT, INR,  in the last 168 hours Cardiac Enzymes: No results found for this basename: CKTOTAL, CKMB, CKMBINDEX, TROPONINI,  in the last 168 hours Urinalysis: No results found for this basename: COLORURINE, APPERANCEUR, LABSPEC, PHURINE, GLUCOSEU, HGBUR, BILIRUBINUR, KETONESUR, PROTEINUR, UROBILINOGEN, NITRITE, LEUKOCYTESUR,  in the last 168 hours Lipid Panel    Component Value Date/Time   CHOL 149 06/05/2013 0522   TRIG 155* 06/05/2013 0522   HDL 40 06/05/2013 0522   CHOLHDL 3.7 06/05/2013 0522   VLDL 31 06/05/2013 0522   LDLCALC 78 06/05/2013 0522   HgbA1C  No results found for this basename: HGBA1C    Urine Drug Screen:   No results found for this basename: labopia,  cocainscrnur,  labbenz,  amphetmu,  thcu,  labbarb    Alcohol Level: No results found for this basename: ETH,  in the last 168 hours   CT of the brain   CT angio of the head  06/05/2013     1. No significant interval change in size and distribution of ischemic infarcts involving the  inferior left cerebellar hemisphere. No evidence of hemorrhagic conversion. 2. Diminutive but patent left vertebral artery to the level of the vertebrobasilar junction. The posterior inferior cerebral arteries are opacified bilaterally without evidence of occlusion or definite high-grade flow-limiting stenosis. 3. Dominant right vertebral artery. 4. No proximal branch occlusion or high-grade flow-limiting stenosis identified within the intracranial circulation. 5. No intracranial aneurysm.  CT angio of the neck 06/05/2013     1. Normal CTA of the neck without evidence of high-grade flow-limiting stenosis, dissection, or occlusion. The left vertebral artery is diminutive along its entire course but grossly patent.  MRI of the brain  ([place]) acute left inferior cerebellar infarcts and slow/occluded flow in the left  vertebral.  MRA of the brain  See CT angio head  2D Echocardiogram    Carotid Doppler  ([place]) See CT angio neck  CXR  06/04/2013   No active cardiopulmonary disease.  Therapy Recommendations   Physical Exam   Pleasant young Caucasian male not in distress.Awake alert. Afebrile. Head is nontraumatic. Neck is supple without bruit. Hearing is normal. Cardiac exam no murmur or gallop. Lungs are clear to auscultation. Distal pulses are well felt. Neurological Exam ;  Awake  Alert oriented x 3. Normal speech and language.eye movements full without nystagmus.fundi were not visualized. Vision acuity and fields appear normal. Hearing is normal. Palatal movements are normal. Face symmetric. Tongue midline. Normal strength, tone, reflexes and coordination. Normal sensation. Gait deferred. ASSESSMENT [name] is a 43 y.o. male presenting with nausea, vomiting and gait instability.  Imaging confirms left cerebellar infarcts. Infarct felt to be embolic secondary to unknown source.  On no antithrombotics prior to admission. Now on aspirin 325 mg orally every day for secondary stroke prevention. Patient with resultant ataxia, nasuea and vomiting have resolved. Stroke work up underway.  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 endocarditis  Suspect obstructive sleep apnea, snores at night, takes ambien for insomnia  Hospital day # 1  TREATMENT/PLAN  Continue aspirin 325 mg orally every day for secondary stroke prevention.  Hypercoagulable and vasculitic panel  Blood cultures x 2  F/u 2D, HgbA1c TEE to look for embolic source. Arranged with [facility] for tomorrow.  If positive for PFO (patent  foramen ovale), check bilateral lower extremity venous dopplers to rule out DVT as possible source of stroke. (I have made patient NPO after midnight tonight). OP sleep study Therapy evals  Dr. Pearlean BrownieSethi discussed diagnosis, prognosis,  treatment options and plan of care with patient, wife and Dr. Sharl MaLama.   Annie MainSharon Biby, MSN, RN, ANVP-BC, AGPCNP-BC [facility] Pager: [phone] 06/05/2013 9:52 AM  I have personally obtained a history, examined the patient, evaluated imaging results, and formulated the assessment and plan of care. I agree with the above. Delia HeadyPramod Sethi, MD   To contact Stroke Continuity provider, please refer to [email or web address]. After hours, contact General Neurology

## 2013-06-05 NOTE — Evaluation (Addendum)
Speech Language Pathology Evaluation Patient Details Name: Preston DuvalJeffrey W Nguyen MRN: [id number] DOB: 07/03/1970 Today's Date: 06/05/2013 Time: [id number] SLP Time Calculation (min): 31 min  Problem List:  Patient Active Problem List   Diagnosis Date Noted  . Cerebellar infarction 06/04/2013  . Essential hypertension, benign 06/04/2013  . Hyperglycemia 06/04/2013   Past Medical History:  Past Medical History  Diagnosis Date  . Hypertension   . Seasonal allergic rhinitis   . Insomnia    Past Surgical History:  Past Surgical History  Procedure Laterality Date  . Wisdom tooth extraction  05/2013   HPI:  43 yo male transferred from [place] with gait disturbance and vomiting.  Pt with h/o HTN, recent dental surgery, allergies.  He was found to have left cerebellar CVA.  Speech/languge evaluation ordered.   Assessment / Plan / Recommendation Clinical Impression  Pt presents with functional speech, language and cognitive skills.  Expressive and receptive language and speech are clear and fluent. Pt is able to follow multiple step commands without difficulties.  He demonstrated complex problem solving/reasoning on written task and recalled 4 words within 13 minutes without cues. Pt stated all of his home medications without deficits.  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  No SLP indicated at this time, thanks for this referral.      SLP Assessment  Patient does not need any further Speech Lanaguage Pathology Services    Follow Up Recommendations  None    Frequency and Duration   n/a     Pertinent Vitals/Pain Afebrile, clear    SLP Evaluation Prior Functioning  Cognitive/Linguistic Baseline: Within functional limits Type of Home: House  Lives With: Spouse Education: MBA- spouse works as Event organiserschool teacher Vocation: Full time employment   Cognition  Overall Cognitive Status: Within  Functional Limits for tasks assessed Arousal/Alertness: Awake/alert Orientation Level: Oriented X4 Attention: Focused;Sustained;Selective Focused Attention: Appears intact Sustained Attention: Appears intact Selective Attention: Appears intact Memory: Appears intact (pt able to recall 4 words within 15 minutes without cues) Awareness: Appears intact Problem Solving: Appears intact Safety/Judgment: Appears intact    Comprehension  Auditory Comprehension Overall Auditory Comprehension: Appears within functional limits for tasks assessed Yes/No Questions: Not tested Commands: Within Functional Limits Multistep Basic Commands: 75-100% accurate Complex Commands: 75-100% accurate Conversation: Complex Visual Recognition/Discrimination Discrimination: Within Function Limits Reading Comprehension Reading Status: Within funtional limits    Expression Expression Primary Mode of Expression: Verbal Verbal Expression Overall Verbal Expression: Appears within functional limits for tasks assessed Initiation: No impairment Level of Generative/Spontaneous Verbalization: Conversation Naming: Not tested Pragmatics: No impairment Written Expression Dominant Hand: Right Written Expression: Within Functional Limits   Oral / Motor Oral Motor/Sensory Function Overall Oral Motor/Sensory Function: Appears within functional limits for tasks assessed Motor Speech Overall Motor Speech: Appears within functional limits for tasks assessed Respiration: Within functional limits Phonation: Normal Resonance: Within functional limits Articulation: Within functional limitis Intelligibility: Intelligible Motor Planning: Witnin functional limits Motor Speech Errors: Not applicable   GO     Donavan Burnetamara Atia Haupt, MS [facility] SLP [phone]

## 2013-06-06 ENCOUNTER — Encounter (HOSPITAL_COMMUNITY): Payer: Self-pay | Admitting: *Deleted

## 2013-06-06 ENCOUNTER — Encounter (HOSPITAL_COMMUNITY)
Admission: AD | Disposition: A | Discharge status: Home / Self Care | Payer: Self-pay | Source: Other Acute Inpatient Hospital | Attending: Family Medicine

## 2013-06-06 DIAGNOSIS — I6789 Other cerebrovascular disease: Secondary | ICD-10-CM

## 2013-06-06 DIAGNOSIS — I6350 Cerebral infarction due to unspecified occlusion or stenosis of unspecified cerebral artery: Secondary | ICD-10-CM

## 2013-06-06 DIAGNOSIS — Q2111 Secundum atrial septal defect: Secondary | ICD-10-CM

## 2013-06-06 DIAGNOSIS — Q211 Atrial septal defect: Secondary | ICD-10-CM

## 2013-06-06 HISTORY — PX: TEE WITHOUT CARDIOVERSION: SHX5443

## 2013-06-06 LAB — GLUCOSE, CAPILLARY
GLUCOSE-CAPILLARY: 129 mg/dL — AB (ref 70–99)
Glucose-Capillary: 152 mg/dL — ABNORMAL HIGH (ref 70–99)

## 2013-06-06 LAB — LUPUS ANTICOAGULANT PANEL
DRVVT: 39 secs (ref <42.9)
Lupus Anticoagulant: NOT DETECTED
PTT Lupus Anticoagulant: 37.3 secs (ref 28.0–43.0)

## 2013-06-06 LAB — CARDIOLIPIN ANTIBODIES, IGG, IGM, IGA
Anticardiolipin IgA: 6 APL U/mL — ABNORMAL LOW (ref <22)
Anticardiolipin IgG: 6 GPL U/mL — ABNORMAL LOW (ref <23)
Anticardiolipin IgM: 3 MPL U/mL — ABNORMAL LOW (ref <11)

## 2013-06-06 LAB — ANA: Anti Nuclear Antibody(ANA): NEGATIVE

## 2013-06-06 LAB — HIV ANTIBODY (ROUTINE TESTING W REFLEX): HIV 1&2 Ab, 4th Generation: NONREACTIVE

## 2013-06-06 SURGERY — ECHOCARDIOGRAM, TRANSESOPHAGEAL
Anesthesia: Moderate Sedation

## 2013-06-06 MED ORDER — FENTANYL CITRATE 0.05 MG/ML IJ SOLN
INTRAMUSCULAR | Status: AC
  Filled 2013-06-06: qty 2

## 2013-06-06 MED ORDER — ATORVASTATIN CALCIUM 40 MG PO TABS: 40.0000 mg | ORAL_TABLET | Freq: Every day | ORAL | Status: DC

## 2013-06-06 MED ORDER — MIDAZOLAM HCL 5 MG/ML IJ SOLN
INTRAMUSCULAR | Status: AC
  Filled 2013-06-06: qty 2

## 2013-06-06 MED ORDER — BUTAMBEN-TETRACAINE-BENZOCAINE 2-2-14 % EX AERO
INHALATION_SPRAY | CUTANEOUS | Status: DC | PRN
  Administered 2013-06-06: 2 via TOPICAL

## 2013-06-06 MED ORDER — LIDOCAINE VISCOUS 2 % MT SOLN
OROMUCOSAL | Status: AC
  Filled 2013-06-06: qty 15

## 2013-06-06 MED ORDER — MIDAZOLAM HCL 10 MG/2ML IJ SOLN
INTRAMUSCULAR | Status: DC | PRN
  Administered 2013-06-06: 2 mg via INTRAVENOUS
  Administered 2013-06-06: 1 mg via INTRAVENOUS
  Administered 2013-06-06: 2 mg via INTRAVENOUS
  Administered 2013-06-06: 1 mg via INTRAVENOUS

## 2013-06-06 MED ORDER — DIPHENHYDRAMINE HCL 50 MG/ML IJ SOLN
INTRAMUSCULAR | Status: AC
  Filled 2013-06-06: qty 1

## 2013-06-06 MED ORDER — ASPIRIN 325 MG PO TABS: 325.0000 mg | ORAL_TABLET | Freq: Every day | ORAL | Status: DC

## 2013-06-06 MED ORDER — LIDOCAINE VISCOUS 2 % MT SOLN
OROMUCOSAL | Status: DC | PRN
  Administered 2013-06-06: 20 mL via OROMUCOSAL

## 2013-06-06 MED ORDER — FENTANYL CITRATE 0.05 MG/ML IJ SOLN
INTRAMUSCULAR | Status: DC | PRN
  Administered 2013-06-06 (×2): 25 ug via INTRAVENOUS

## 2013-06-06 NOTE — Progress Notes (Signed)
VASCULAR LAB PRELIMINARY  PRELIMINARY  PRELIMINARY  PRELIMINARY  Bilateral lower extremity venous duplex  completed.    Preliminary report:  Bilateral:  No evidence of DVT, superficial thrombosis, or Baker's Cyst.    Gara KronerHelene F Edie Vallandingham, RVT 06/06/2013, 11:34 AM

## 2013-06-06 NOTE — Progress Notes (Signed)
  Echocardiogram 2D Echocardiogram has been performed.  Preston Nguyen 06/06/2013, 2:48 PM

## 2013-06-06 NOTE — Progress Notes (Signed)
Stroke Team Progress Note  HISTORY Preston Nguyen is an 43 y.o. male who reports that he awakened normal on Wednesday. Just prior to leaving te house he had acute onset of gait imbalance. He felt as if he was falling to the left. He went to lay down but felt no better and he then acutely developed severe nausea and vomiting. This did not get better and EMS was called for the patient to be brought in for evaluation. Patient was initially seen at [facility]. Was sent here for further stroke evaluation. last known well 06/04/2013 at 07:15  Patient was not administered TPA secondary to Outside time window, initially not felt to be a stroke, low NIHSS. He was admitted for further evaluation and treatment.  SUBJECTIVE Just back from TEE. Walking better . Wants to go home. No complaints  OBJECTIVE Most recent Vital Signs: Filed Vitals:   06/05/13 2103 06/06/13 0228 06/06/13 0543 06/06/13 0719  BP: 153/89 124/77 113/75 142/90  Pulse: 90 76 70 80  Temp: 97.9 F (36.6 C) 98 F (36.7 C) 97.8 F (36.6 C) 97.8 F (36.6 C)  TempSrc: Oral Oral Oral Oral  Resp: '19 18 20 13  ' Height:      Weight:      SpO2: 99% 100% 100% 98%   CBG (last 3)   Recent Labs  06/05/13 1703 06/05/13 2120 06/06/13 0625  GLUCAP 116* 115* 129*    IV Fluid Intake:     MEDICATIONS  .  stroke: mapping our early stages of recovery book   Does not apply Once  . aspirin  325 mg Oral Daily  . atorvastatin  40 mg Oral q1800  . enoxaparin (LOVENOX) injection  40 mg Subcutaneous Daily   PRN:  zolpidem  Diet:  NPO thin liquids Activity:   DVT Prophylaxis:  Lovenox 40 mg sq daily   CLINICALLY SIGNIFICANT STUDIES Basic Metabolic Panel:   Recent Labs Lab 06/05/13 0012  NA 142  K 3.7  CL 104  CO2 21  GLUCOSE 141*  BUN 19  CREATININE 1.01  CALCIUM 8.8   Liver Function Tests: No results found for this basename: AST, ALT, ALKPHOS, BILITOT, PROT, ALBUMIN,  in the last 168 hours CBC:   Recent Labs Lab  06/05/13 0012  WBC 13.5*  HGB 13.7  HCT 39.3  MCV 87.1  PLT 310   Coagulation: No results found for this basename: LABPROT, INR,  in the last 168 hours Cardiac Enzymes: No results found for this basename: CKTOTAL, CKMB, CKMBINDEX, TROPONINI,  in the last 168 hours Urinalysis: No results found for this basename: COLORURINE, APPERANCEUR, LABSPEC, PHURINE, GLUCOSEU, HGBUR, BILIRUBINUR, KETONESUR, PROTEINUR, UROBILINOGEN, NITRITE, LEUKOCYTESUR,  in the last 168 hours Lipid Panel    Component Value Date/Time   CHOL 149 06/05/2013 0522   TRIG 155* 06/05/2013 0522   HDL 40 06/05/2013 0522   CHOLHDL 3.7 06/05/2013 0522   VLDL 31 06/05/2013 0522   LDLCALC 78 06/05/2013 0522   HgbA1C  Lab Results  Component Value Date   HGBA1C 6.0* 06/05/2013    Urine Drug Screen:   No results found for this basename: labopia,  cocainscrnur,  labbenz,  amphetmu,  thcu,  labbarb    Alcohol Level: No results found for this basename: ETH,  in the last 168 hours  Ischemic Hypercoagulable & Vasculitic Workup Normal - RPR, ESR, HIV Pending - C3, C4, CH50, ANA, lupus anticoagulant, cardiolipin antibody   CT of the brain   CT angio of the head  06/05/2013     1. No significant interval change in size and distribution of ischemic infarcts involving the inferior left cerebellar hemisphere. No evidence of hemorrhagic conversion. 2. Diminutive but patent left vertebral artery to the level of the vertebrobasilar junction. The posterior inferior cerebral arteries are opacified bilaterally without evidence of occlusion or definite high-grade flow-limiting stenosis. 3. Dominant right vertebral artery. 4. No proximal branch occlusion or high-grade flow-limiting stenosis identified within the intracranial circulation. 5. No intracranial aneurysm.  CT angio of the neck 06/05/2013     1. Normal CTA of the neck without evidence of high-grade flow-limiting stenosis, dissection, or occlusion. The left vertebral artery is diminutive  along its entire course but grossly patent.  MRI of the brain  ([facility]) acute left inferior cerebellar infarcts and slow/occluded flow in the left vertebral.  MRA of the brain  See CT angio head  2D Echocardiogram  Results pending  Carotid Doppler  ([place]) No evidence of hemodynamically significant internal carotid artery stenosis. Vertebral artery flow is antegrade.   CXR  06/04/2013   No active cardiopulmonary disease. Blood cultures 06/05/13 prelim results no growth TEE no clot/vegetation, small PFO Therapy Recommendations no PT needs  Physical Exam   Pleasant young Caucasian male not in distress.Awake alert. Afebrile. Head is nontraumatic. Neck is supple without bruit. Hearing is normal. Cardiac exam no murmur or gallop. Lungs are clear to auscultation. Distal pulses are well felt. Neurological Exam ;  Awake  Alert oriented x 3. Normal speech and language.eye movements full without nystagmus.fundi were not visualized. Vision acuity and fields appear normal. Hearing is normal. Palatal movements are normal. Face symmetric. Tongue midline. Normal strength, tone, reflexes and coordination. Normal sensation. Gait deferred.  ASSESSMENT [name] is a 43 y.o. male presenting with nausea, vomiting and gait instability.  Imaging confirms left cerebellar infarcts. Infarct felt to be embolic secondary to unknown source.  On no antithrombotics prior to admission. Now on aspirin 325 mg orally every day for secondary stroke prevention. Patient with resultant ataxia, nausea and vomiting have resolved. Stroke work up underway.  hypertension High levels of stress from work, works as a Engineer, building services for Teacher, English as a foreign language in [employer]  LDL 78, started on lipitor 40 mg daily  Family hx stroke (mother in 91s, maternal grandmother)  Lekuocytosis, wbc 13.5  No temperature  Wisdom tooth extraction 05/2013, had abx prophylaxis  ? endocarditis  Suspect obstructive sleep apnea, snores at  night, takes ambien for insomnia  Hyperglycemia, HgbA1c 6.0  Hospital day # 2  TREATMENT/PLAN  Continue aspirin 325 mg orally every day for secondary stroke prevention.  F/u Hypercoagulable and vasculitic panel  F/u Blood cultures x 2  F/u 2D results  OP sleep study to be arranged at [place] center at [facility] for discharge following  LE venous dopplers. If positive for DVT change aspirin to xarelto F/U Dr Leonie Man for outpatient TCD Bubble study/emboli monitoring in 2 weeks. Stay out of work till that visit D/w Wife, patienta nd Dr Higinio Plan, MD   To contact Stroke Continuity provider, please refer to [email or web address] After hours, contact General Neurology

## 2013-06-06 NOTE — CV Procedure (Signed)
      PROCEDURE NOTE  Procedure:  Transesophageal echocardiogram Operator:  Armanda Magicraci Turner, MD Indications:  CVA  IV Meds: Versed 6mg , Fentanyl 50mcg IV Complications:None  Results: Normal LV size and function Normal RV size and function Normal RA Normal LA and LAA Normal TV with trivial TR Normal MV with trivial MR Normal trileaflet AV  Normal PV with trivial PR Normal ascending and thoracic aorta There appears to be a PFO by colorflow doppler.  By agitated saline contrast there was evidence of flow of bubble into the LA after cough only.  Given findings of colorflow and delayed bubbles in LA with cough would assume patient has a PFO.  Patient tolerated procedure well

## 2013-06-06 NOTE — Progress Notes (Signed)
  Echocardiogram 2D Echocardiogram has been performed.  Preston Nguyen 06/06/2013, 8:46 AM

## 2013-06-06 NOTE — Discharge Instructions (Signed)
Follow with Dr Pearlean BrownieSethi for TCD Bubble study/emboli monitoring in 2-3 weeks. Call [phone] to schedule

## 2013-06-06 NOTE — Progress Notes (Signed)
Discharge orders received, pt for discharge home today,  IV D/C,  D/C instructions and Rx given with verbalized understanding.  Family at bedside to assist pt with discharge. Staff brought pt downstairs via wheelchair.  

## 2013-06-06 NOTE — Interval H&P Note (Signed)
History and Physical Interval Note:  06/06/2013 8:04 AM  Preston Nguyen  has presented today for surgery, with the diagnosis of STROKE  The various methods of treatment have been discussed with the patient and family. After consideration of risks, benefits and other options for treatment, the patient has consented to  Procedure(s): TRANSESOPHAGEAL ECHOCARDIOGRAM (TEE) (N/A) as a surgical intervention .  The patient's history has been reviewed, patient examined, no change in status, stable for surgery.  I have reviewed the patient's chart and labs.  Questions were answered to the patient's satisfaction.     Quintella Reichertraci R Kasson Lamere

## 2013-06-06 NOTE — Progress Notes (Signed)
Physical Therapy Treatment Patient Details Name: Preston Nguyen MRN: [id number] DOB: Nov 02, 1970 Today's Date: 06/06/2013    History of Present Illness Admitted after sudden very acute onset of dizziness and gait instability that progressed to N/V, MRI showed L cerebellar infarct.    PT Comments    Has met goals.  Looks close to baseline.  Still would benefit from balance Lab at outpatient  Follow Up Recommendations  Outpatient PT;Other (comment) (Balance Lab in [place] or [place])     Equipment Recommendations  None recommended by PT    Recommendations for Other Services       Precautions / Restrictions Precautions Precautions: Fall    Mobility  Bed Mobility Overal bed mobility: Modified Independent                Transfers Overall transfer level: Needs assistance Equipment used: None Transfers: Sit to/from Stand Sit to Stand: Supervision;Modified independent (Device/Increase time)         General transfer comment: generally fluid and normalized movement  Ambulation/Gait Ambulation/Gait assistance: Independent (in home-like environment) Ambulation Distance (Feet): 700 Feet Assistive device: None Gait Pattern/deviations: WFL(Within Functional Limits)     General Gait Details: steady and safe in a home-like environment   Stairs Stairs: Yes Stairs assistance: Modified independent (Device/Increase time)   Number of Stairs: 12 General stair comments: fluid and safe with rail  Wheelchair Mobility    Modified Rankin (Stroke Patients Only) Modified Rankin (Stroke Patients Only) Pre-Morbid Rankin Score: No symptoms Modified Rankin: Slight disability     Balance Overall balance assessment: Needs assistance Sitting-balance support: Feet supported Sitting balance-Leahy Scale: Normal     Standing balance support: No upper extremity supported Standing balance-Leahy Scale: Normal               High level balance activites: Side  stepping;Backward walking;Direction changes;Turns;Sudden stops;Head turns;Other (comment) (picking up objects "on the fly") High Level Balance Comments: No discernible LOB or deviations    Cognition Arousal/Alertness: Awake/alert Behavior During Therapy: WFL for tasks assessed/performed Overall Cognitive Status: Within Functional Limits for tasks assessed                      Exercises      General Comments        Pertinent Vitals/Pain     Home Living                      Prior Function            PT Goals (current goals can now be found in the care plan section) Acute Rehab PT Goals Patient Stated Goal: back to work PT Goal Formulation: With patient Time For Goal Achievement: 06/12/13 Potential to Achieve Goals: Good Progress towards PT goals: Progressing toward goals    Frequency       PT Plan Current plan remains appropriate    Co-evaluation             End of Session   Activity Tolerance: Patient tolerated treatment well Patient left: with family/visitor present     Time: 7673-4193 PT Time Calculation (min): 18 min  Charges:  $Gait Training: 8-22 mins                    G CodesTessie Fass Dujuan Stankowski 06/06/2013, 12:04 PM 06/06/2013  Donnella Sham, PT [phone]  (pager)

## 2013-06-06 NOTE — Discharge Summary (Addendum)
Physician Discharge Summary  Lonna DuvalJeffrey W Oberman ZOX:[id number] DOB: 02/19/1970 DOA: 06/04/2013  PCP: No primary provider on file.  Admit date: 06/04/2013 Discharge date: 06/06/2013  Time spent: *50 minutes  Recommendations for Outpatient Follow-up:  1. *Follow up PCP in 2 weeks 2. PCP to follow the lab results for hypercoagulable and vasculitic work up   Discharge Diagnoses:  Principal Problem:   Cerebellar infarction Active Problems:   Essential hypertension, benign   Hyperglycemia   CVA (cerebral infarction)   Discharge Condition: Stable  Diet recommendation: Low fat diet  Filed Weights   06/04/13 2200  Weight: 105.1 kg (231 lb 11.3 oz)    History of present illness:  43 y.o. male.  He has hypertension controlled on lisinopril. Also has insomnia treated with Ambien at bedtime. Presumed seasonal allergic rhinitis on Allegra.  The patient had a extraction of a wisdom tooth approximately 2 weeks ago. Subsequently he was taking ibuprofen in addition to a hydrocodone-acetaminophen preparation. Overall he was recovering from the extraction in terms of his dental pain; he awoke this morning and had breakfast and said he felt good. Subsequently he developed the very acute onset of dizziness and gait instability. He was stumbling to walk and favoring his left side. He felt generalized dizziness is not described as room spinning around her inability to keep his balance. He had significant nausea and vomiting times many episodes. In terms of the patient's other symptoms he says his bowel function and urinary function is normal. He denies chest pain or shortness of breath. His insomnia is chronic. He does report significant stress at work and questions whether that contributes to his risk of stroke.  Because of this he was evaluated [facility] with initial concern for vertigo. OSH records reviewed and summarized: He underwent CT scan of the head which was unremarkable but to do a  suspicion for intracranial pathology patient underwent MRI of the brain which did show acute cerebellar infarction. The patient has family preferred definitive neurologic assessment which [facility]   Hospital Course:  Acute cerebellar infarction.  Patient had MRI at the outside hospital which showed acute left cerebellar infarction and evidence of swelling of the left vertebral flow possibly in relation to the infarction. The patient has artery undergone the start of an appropriate workup including telemetry monitoring, EKG which shows sinus rhythm, carotid duplex which shows mild bilateral plaques. The patient does have risk factors of family history of early stroke, hypertension. Patient was seen by neurology TEE was recommended, TEE is negative for thrombus but showed small PFO. Hypercoagulable and vasculitis panel is negative so far, but the final result is pending. Continue with aspirin 325 mg po daily.   Hypertension  BP is well controlled, will hcontinue with lisinopril  Hyperglycemia  Blood glucose has been elevated.  Hb a1c is 6.0    Procedures  TEE, carotid duplex  Consultations:  Neurology  Discharge Exam: Filed Vitals:   06/06/13 1502  BP: 144/91  Pulse: 98  Temp: 98.1 F (36.7 C)  Resp: 20    General: Appear in no acute distress Cardiovascular: S1`s2 RRR Respiratory: Clear bilaterally  Discharge Instructions You were cared for by a hospitalist during your hospital stay. If you have any questions about your discharge medications or the care you received while you were in the hospital after you are discharged, you can call the unit and asked to speak with the hospitalist on call if the hospitalist  that took care of you is not available. Once you are discharged, your primary care physician will handle any further medical issues. Please note that NO REFILLS for any discharge medications will be authorized once you are  discharged, as it is imperative that you return to your primary care physician (or establish a relationship with a primary care physician if you do not have one) for your aftercare needs so that they can reassess your need for medications and monitor your lab values.  Discharge Orders   Future Orders Complete By Expires   Diet - low sodium heart healthy  As directed    Discharge instructions  As directed    Increase activity slowly  As directed        Medication List         aspirin 325 MG tablet  Take 1 tablet (325 mg total) by mouth daily.     atorvastatin 40 MG tablet  Commonly known as:  LIPITOR  Take 1 tablet (40 mg total) by mouth daily at 6 PM.     fexofenadine 180 MG tablet  Commonly known as:  ALLEGRA  Take 180 mg by mouth daily as needed for allergies. Dose unkn     HYDROcodone-acetaminophen 5-325 MG per tablet  Commonly known as:  NORCO/VICODIN  Take 2 tablets by mouth every 6 (six) hours as needed for moderate pain.     lisinopril 10 MG tablet  Commonly known as:  PRINIVIL,ZESTRIL  Take 10 mg by mouth daily.     multivitamin with minerals Tabs tablet  Take 1 tablet by mouth daily.     zolpidem 10 MG tablet  Commonly known as:  AMBIEN  Take 10 mg by mouth at bedtime.       No Known Allergies    The results of significant diagnostics from this hospitalization (including imaging, microbiology, ancillary and laboratory) are listed below for reference.    Significant Diagnostic Studies: Ct Angio Head W/cm &/or Wo Cm  06/05/2013   CLINICAL DATA:  Gait imbalance, nausea, vomiting. Prior MRI shows acute left inferior cerebellar infarcts.  EXAM: CT ANGIOGRAPHY HEAD AND NECK  TECHNIQUE: Multidetector CT imaging of the head and neck was performed using the standard protocol during bolus administration of intravenous contrast. Multiplanar CT image reconstructions and MIPs were obtained to evaluate the vascular anatomy. Carotid stenosis measurements (when applicable)  are obtained utilizing NASCET criteria, using the distal internal carotid diameter as the denominator.  CONTRAST:  50mL OMNIPAQUE IOHEXOL 350 MG/ML SOLN  COMPARISON:  Prior MRI from 06/04/2013.  FINDINGS: Focal hypodensities within the inferior aspect of the left cerebellar hemisphere again seen, compatible with previous identified acute ischemic infarcts within this region. No evidence of hemorrhagic conversion. No new large vessel territory infarct identified. No mass lesion, midline shift, or extra-axial fluid collection. No hydrocephalus.  The calvarium is intact. Orbits are normal. Paranasal sinuses and mastoid air cells are clear.  CTA HEAD FINDINGS  ANTERIOR CIRCULATION:  The petrous, cavernous, and supra clinoid segments of the internal carotid arteries are well opacified bilaterally without evidence of high-grade flow-limiting stenosis or other abnormality. Ophthalmic arteries are grossly normal. The A1 segments are well opacified and symmetric bilaterally. Anterior communicating artery is normal. Anterior cerebral arteries are well well opacified bilaterally.  The middle cerebral arteries including the M1 segments and their distal branches are well opacified bilaterally. No high-grade proximal conclusion.  No aneurysm seen within the anterior circulation.  POSTERIOR CIRCULATION:  The left V4 segment is markedly  diminutive as compared to the right but grossly patent. No definite high-grade flow-limiting stenosis. The posterior inferior cerebral arteries are opacified bilaterally. The vertebrobasilar junction is normal. The basilar artery is widely patent without evidence of high-grade stenosis or occlusion. The superior cerebellar arteries and anterior inferior cerebral arteries are well opacified bilaterally.  No aneurysm identified within the posterior circulation.  Review of the MIP images confirms the above findings.  CTA NECK FINDINGS  Visualized aortic arch is of normal caliber with normal 3 vessel  morphology. No high-grade flow-limiting stenosis seen at the origin of the great vessels. Should Eb[place] Cargo arteries are widely patent bilaterally.  The common carotid arteries are well opacified bilaterally and of symmetric caliber. No evidence of high-grade flow-limiting stenosis or dissection. The carotid bifurcations are normal bilaterally. Minimal atherosclerotic plaque noted about the proximal left internal carotid artery. The internal carotid arteries are well opacified bilaterally without evidence of high-grade flow-limiting stenosis, occlusion, or dissection.  The external carotid arteries and their branches are within normal limits.  The vertebral arteries arise from the subclavian arteries bilaterally. The right vertebral artery is dominant. The right vertebral artery is well opacified along its entire course without evidence of high-grade flow-limiting stenosis, occlusion, or dissection. The left vertebral artery is markedly diminutive as compared to the right but is grossly patent to the level of the vertebrobasilar junction.  Review of the MIP images confirms the above findings.  Visualized soft tissues of the neck are within normal limits without evidence of mass lesion, adenopathy, or other acute abnormality. Multilevel degenerative changes and within the visualized spine. Visualized lung apices are clear.  IMPRESSION: CTA HEAD:  1. No significant interval change in size and distribution of ischemic infarcts involving the inferior left cerebellar hemisphere. No evidence of hemorrhagic conversion. 2. Diminutive but patent left vertebral artery to the level of the vertebrobasilar junction. The posterior inferior cerebral arteries are opacified bilaterally without evidence of occlusion or definite high-grade flow-limiting stenosis. 3. Dominant right vertebral artery. 4. No proximal branch occlusion or high-grade flow-limiting stenosis identified within the intracranial circulation. 5. No intracranial  aneurysm. CTA NECK:  1. Normal CTA of the neck without evidence of high-grade flow-limiting stenosis, dissection, or occlusion. The left vertebral artery is diminutive along its entire course but grossly patent.   Electronically Signed   By: Rise Mu M.D.   On: 06/05/2013 06:08   Dg Chest 2 View  06/04/2013   CLINICAL DATA:  Weakness, dizziness, vomiting. Cough. Retrocardiac opacity seen on OSH plain film.  EXAM: CHEST  2 VIEW  COMPARISON:  DG CHEST 1V PORT dated 06/04/2013  FINDINGS: The heart size and mediastinal contours are within normal limits. Both lungs are clear. The visualized skeletal structures are unremarkable.  IMPRESSION: No active cardiopulmonary disease.   Electronically Signed   By: Burman Nieves M.D.   On: 06/04/2013 23:02   Ct Angio Neck W/cm &/or Wo/cm  06/05/2013   CLINICAL DATA:  Gait imbalance, nausea, vomiting. Prior MRI shows acute left inferior cerebellar infarcts.  EXAM: CT ANGIOGRAPHY HEAD AND NECK  TECHNIQUE: Multidetector CT imaging of the head and neck was performed using the standard protocol during bolus administration of intravenous contrast. Multiplanar CT image reconstructions and MIPs were obtained to evaluate the vascular anatomy. Carotid stenosis measurements (when applicable) are obtained utilizing NASCET criteria, using the distal internal carotid diameter as the denominator.  CONTRAST:  50mL OMNIPAQUE IOHEXOL 350 MG/ML SOLN  COMPARISON:  Prior MRI from 06/04/2013.  FINDINGS: Focal hypodensities within  the inferior aspect of the left cerebellar hemisphere again seen, compatible with previous identified acute ischemic infarcts within this region. No evidence of hemorrhagic conversion. No new large vessel territory infarct identified. No mass lesion, midline shift, or extra-axial fluid collection. No hydrocephalus.  The calvarium is intact. Orbits are normal. Paranasal sinuses and mastoid air cells are clear.  CTA HEAD FINDINGS  ANTERIOR CIRCULATION:  The  petrous, cavernous, and supra clinoid segments of the internal carotid arteries are well opacified bilaterally without evidence of high-grade flow-limiting stenosis or other abnormality. Ophthalmic arteries are grossly normal. The A1 segments are well opacified and symmetric bilaterally. Anterior communicating artery is normal. Anterior cerebral arteries are well well opacified bilaterally.  The middle cerebral arteries including the M1 segments and their distal branches are well opacified bilaterally. No high-grade proximal conclusion.  No aneurysm seen within the anterior circulation.  POSTERIOR CIRCULATION:  The left V4 segment is markedly diminutive as compared to the right but grossly patent. No definite high-grade flow-limiting stenosis. The posterior inferior cerebral arteries are opacified bilaterally. The vertebrobasilar junction is normal. The basilar artery is widely patent without evidence of high-grade stenosis or occlusion. The superior cerebellar arteries and anterior inferior cerebral arteries are well opacified bilaterally.  No aneurysm identified within the posterior circulation.  Review of the MIP images confirms the above findings.  CTA NECK FINDINGS  Visualized aortic arch is of normal caliber with normal 3 vessel morphology. No high-grade flow-limiting stenosis seen at the origin of the great vessels. Should Eb[place] Cargo arteries are widely patent bilaterally.  The common carotid arteries are well opacified bilaterally and of symmetric caliber. No evidence of high-grade flow-limiting stenosis or dissection. The carotid bifurcations are normal bilaterally. Minimal atherosclerotic plaque noted about the proximal left internal carotid artery. The internal carotid arteries are well opacified bilaterally without evidence of high-grade flow-limiting stenosis, occlusion, or dissection.  The external carotid arteries and their branches are within normal limits.  The vertebral arteries arise from the  subclavian arteries bilaterally. The right vertebral artery is dominant. The right vertebral artery is well opacified along its entire course without evidence of high-grade flow-limiting stenosis, occlusion, or dissection. The left vertebral artery is markedly diminutive as compared to the right but is grossly patent to the level of the vertebrobasilar junction.  Review of the MIP images confirms the above findings.  Visualized soft tissues of the neck are within normal limits without evidence of mass lesion, adenopathy, or other acute abnormality. Multilevel degenerative changes and within the visualized spine. Visualized lung apices are clear.  IMPRESSION: CTA HEAD:  1. No significant interval change in size and distribution of ischemic infarcts involving the inferior left cerebellar hemisphere. No evidence of hemorrhagic conversion. 2. Diminutive but patent left vertebral artery to the level of the vertebrobasilar junction. The posterior inferior cerebral arteries are opacified bilaterally without evidence of occlusion or definite high-grade flow-limiting stenosis. 3. Dominant right vertebral artery. 4. No proximal branch occlusion or high-grade flow-limiting stenosis identified within the intracranial circulation. 5. No intracranial aneurysm. CTA NECK:  1. Normal CTA of the neck without evidence of high-grade flow-limiting stenosis, dissection, or occlusion. The left vertebral artery is diminutive along its entire course but grossly patent.   Electronically Signed   By: Rise Mu M.D.   On: 06/05/2013 08:17    Microbiology: Recent Results (from the past 240 hour(s))  CULTURE, BLOOD (ROUTINE X 2)     Status: None   Collection Time    06/05/13 11:15 AM  Result Value Ref Range Status   Specimen Description BLOOD RIGHT ANTECUBITAL   Final   Special Requests BOTTLES DRAWN AEROBIC AND ANAEROBIC 10CC   Final   Culture  Setup Time     Final   Value: 06/05/2013 16:33     Performed at [facility]   Culture     Final   Value:        BLOOD CULTURE RECEIVED NO GROWTH TO DATE CULTURE WILL BE HELD FOR 5 DAYS BEFORE ISSUING A FINAL NEGATIVE REPORT     Performed at [facility]   Report Status PENDING   Incomplete  CULTURE, BLOOD (ROUTINE X 2)     Status: None   Collection Time    06/05/13 11:30 AM      Result Value Ref Range Status   Specimen Description BLOOD RIGHT ANTECUBITAL   Final   Special Requests BOTTLES DRAWN AEROBIC ONLY 3CC   Final   Culture  Setup Time     Final   Value: 06/05/2013 16:33     Performed at [facility]   Culture     Final   Value:        BLOOD CULTURE RECEIVED NO GROWTH TO DATE CULTURE WILL BE HELD FOR 5 DAYS BEFORE ISSUING A FINAL NEGATIVE REPORT     Performed at [facility]   Report Status PENDING   Incomplete     Labs: Basic Metabolic Panel:  Recent Labs Lab 06/05/13 0012  NA 142  K 3.7  CL 104  CO2 21  GLUCOSE 141*  BUN 19  CREATININE 1.01  CALCIUM 8.8   Liver Function Tests: No results found for this basename: AST, ALT, ALKPHOS, BILITOT, PROT, ALBUMIN,  in the last 168 hours No results found for this basename: LIPASE, AMYLASE,  in the last 168 hours No results found for this basename: AMMONIA,  in the last 168 hours CBC:  Recent Labs Lab 06/05/13 0012  WBC 13.5*  HGB 13.7  HCT 39.3  MCV 87.1  PLT 310   Cardiac Enzymes: No results found for this basename: CKTOTAL, CKMB, CKMBINDEX, TROPONINI,  in the last 168 hours BNP: BNP (last 3 results) No results found for this basename: PROBNP,  in the last 8760 hours CBG:  Recent Labs Lab 06/05/13 1146 06/05/13 1703 06/05/13 2120 06/06/13 0625 06/06/13 1120  GLUCAP 162* 116* 115* 129* 152*       Signed:  Gagan S Lama  [facility] 06/06/2013, 3:17 PM

## 2013-06-06 NOTE — Progress Notes (Signed)
OT Cancellation Note  Patient Details Name: Preston Nguyen MRN: [id number] DOB: 09/18/1970   Cancelled Treatment:    Reason Eval/Treat Not Completed: OT screened, no needs identified, will sign off - Observed pt working with PT.  Pt denies vision deficits or coordination deficits.  No OT needs.  PT will address balance.   Boykin ReaperWendi M Junie Engram De SmetWendi Belladonna Lubinski, OTR/L [phone] 06/06/2013, 3:02 PM

## 2013-06-07 LAB — C3 COMPLEMENT: C3 Complement: 126 mg/dL (ref 90–180)

## 2013-06-07 LAB — C4 COMPLEMENT: Complement C4, Body Fluid: 19 mg/dL (ref 10–40)

## 2013-06-09 ENCOUNTER — Encounter (HOSPITAL_COMMUNITY): Payer: Self-pay | Admitting: Cardiology

## 2013-06-09 LAB — COMPLEMENT, TOTAL: COMPL TOTAL (CH50): 55 U/mL (ref 31–60)

## 2013-06-10 ENCOUNTER — Other Ambulatory Visit: Payer: Self-pay | Admitting: Neurology

## 2013-06-10 ENCOUNTER — Encounter: Payer: Self-pay | Admitting: Neurology

## 2013-06-10 ENCOUNTER — Telehealth: Payer: Self-pay | Admitting: Neurology

## 2013-06-10 DIAGNOSIS — I6350 Cerebral infarction due to unspecified occlusion or stenosis of unspecified cerebral artery: Secondary | ICD-10-CM

## 2013-06-10 NOTE — Telephone Encounter (Signed)
Pt calling wanting to make an appt to have bubble study done. Base on progress note from the hosp on 06/06/13, per Dr. Pennie RushingSeth, pt needed to call to have bubble study done in 2 weeks. Please advise

## 2013-06-10 NOTE — Telephone Encounter (Signed)
Patient called to state that he was recently discharged from the hospital and was told to call back and schedule a follow up in 2 weeks and a bubble study, please return call to patient and advise.

## 2013-06-11 LAB — CULTURE, BLOOD (ROUTINE X 2)
Culture: NO GROWTH
Culture: NO GROWTH

## 2013-06-13 ENCOUNTER — Telehealth: Payer: Self-pay | Admitting: *Deleted

## 2013-06-13 ENCOUNTER — Encounter: Payer: Self-pay | Admitting: Neurology

## 2013-06-16 ENCOUNTER — Other Ambulatory Visit: Payer: Self-pay | Admitting: Neurology

## 2013-06-16 DIAGNOSIS — G4733 Obstructive sleep apnea (adult) (pediatric): Secondary | ICD-10-CM

## 2013-06-16 DIAGNOSIS — Z0289 Encounter for other administrative examinations: Secondary | ICD-10-CM

## 2013-06-16 NOTE — Telephone Encounter (Signed)
Pt calling stating that he was in the hosp on 06/04/13 and Dr. Pearlean BrownieSethi saw him at this time and pt stated that Dr. Pearlean BrownieSethi mentioned having a sleep study done and wanted to know if he could have sleep study done before his appt with Dr. Pearlean BrownieSethi on 07/04/13 to have a Bubble done. Please advise

## 2013-06-18 ENCOUNTER — Telehealth: Payer: Self-pay | Admitting: Neurology

## 2013-06-18 NOTE — Telephone Encounter (Signed)
Error

## 2013-06-23 ENCOUNTER — Encounter: Payer: Self-pay | Admitting: Neurology

## 2013-06-23 ENCOUNTER — Ambulatory Visit (INDEPENDENT_AMBULATORY_CARE_PROVIDER_SITE_OTHER): Payer: 59 | Admitting: Neurology

## 2013-06-23 VITALS — BP 128/75 | HR 86 | Resp 16 | Ht 72.75 in | Wt 235.0 lb

## 2013-06-23 DIAGNOSIS — G4730 Sleep apnea, unspecified: Secondary | ICD-10-CM

## 2013-06-23 DIAGNOSIS — R0683 Snoring: Secondary | ICD-10-CM

## 2013-06-23 DIAGNOSIS — I639 Cerebral infarction, unspecified: Secondary | ICD-10-CM

## 2013-06-23 DIAGNOSIS — G4700 Insomnia, unspecified: Secondary | ICD-10-CM

## 2013-06-23 DIAGNOSIS — R0609 Other forms of dyspnea: Secondary | ICD-10-CM

## 2013-06-23 DIAGNOSIS — I6350 Cerebral infarction due to unspecified occlusion or stenosis of unspecified cerebral artery: Secondary | ICD-10-CM

## 2013-06-23 DIAGNOSIS — R0989 Other specified symptoms and signs involving the circulatory and respiratory systems: Secondary | ICD-10-CM

## 2013-06-23 MED ORDER — ZALEPLON 10 MG PO CAPS: 10.0000 mg | ORAL_CAPSULE | Freq: Every evening | ORAL | Status: DC | PRN

## 2013-06-23 NOTE — Progress Notes (Signed)
[facility] SLEEP MEDICINE CLINIC   Provider:  Melvyn Novasarmen  Nadene Witherspoon, [place]  Referring Provider: Micki RileySethi, Pramod S, MD Primary Care Physician:  Clydell HakimFISHER,DONALD EUGENE, MD  Chief Complaint  Patient presents with  . New Evaluation    Room 10  . Sleep consult    HPI:  Preston DuvalJeffrey W Nguyen is a 43 y.o. 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  He  Is seen here as a referral  from Dr. Pearlean BrownieSethi for a sleep consultation.   [name] has been recently admitted to the stroke team at [facility]. He stated that on 06-04-13 he awoke feeling normal by just leaving the house she had an onset of date ataxia and imbalance he fell asleep left as he would drift off  Or fall towards his left side. He was unbalanced, had vertigo. Then he developed severe nausea and vomiting and EMS was called for the patient. He was initially seen at [facility] and was sent here for further stroke evaluation from the local [place].  An MRI confirmed that he suffered a cerebellar stroke. Until then , the patient had been very healthy and was not on regular prescription medications. His blood cell counts and electrolyte panels were normal , his cholesterol was not elevated, but triglycerides were high. He admitted to drinking 6-7 Soda cans a day. He drinks coffee in the morning. He had eaten before this blood draw .   He has been insomniac for several years, 2-3 years now taking Ambien. The patient has no trouble going to sleep, but after 2 hours would be awake without Ambien. With Ambien he sleeps 6-8 hours, mild  hang over effect.  He goes to bed by 9.30 and watches TV until 11 PM, than will go to sleep with the help of Ambien. He will have 6 hours of sleep. Wakes up with headaches. Without the need of an alarm, he rises spontaneously. Even in college , he was an early rises.  He rarely ever naps.   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   The patient has significant retrognathia, his wife witnessed him snoring and having apnea.  She sleeps in a separate bedroom. He has not been back to work since his stroke 21 days ago.     Review of Systems: Out of a complete 14 system review, the patient complains of only the following symptoms, and all other reviewed systems are negative. Hypertension, vertigo, ataxia. Snoring, fatigue ,insomnia, rhinitis. He is still dizzy ( sice the stroke)  when bending down- vertigo. He feels better when standing erect.    History   Social History  . Marital Status: Married    Spouse Name: Preston Nguyen    Number of Children: 1  . Years of Education: Masters   Occupational History  . Not on file.   Social History Main Topics  . Smoking status: Never Smoker   . Smokeless tobacco: Never Used  . Alcohol Use: Yes     Comment: Rare  . Drug Use: No  . Sexual Activity: Not on file   Other Topics Concern  . Not on file   Social History Narrative   Reports significant stress at work.   Patient is married Preston Ice(Niki) and lives at home with his wife and his son.   Patient works full-time.   Patient has a Master's degree.   Patient is right-handed.   Patient  drinks one cup of coffee daily and 6 sodas per day.    Family History  Problem Relation Age of Onset  . Stroke Mother     aged 40s  . Stroke Maternal Grandmother   . Coronary artery disease Mother     age 52s  . Diabetes Mother   . Diabetes Maternal Grandmother   . Diabetes Paternal Grandmother   . Breast cancer Paternal Grandmother   . Cancer Paternal Grandfather   . Heart Problems Maternal Grandfather     Past Medical History  Diagnosis Date  . Hypertension   . Seasonal allergic rhinitis   . Insomnia     Past Surgical History  Procedure Laterality Date  . Wisdom tooth extraction  05/2013  . Hernia repair      "when he was younger"  . Tee without cardioversion N/A 06/06/2013    Procedure: TRANSESOPHAGEAL  ECHOCARDIOGRAM (TEE);  Surgeon: Quintella Reichert, MD;  Location: [facility] ENDOSCOPY;  Service: Cardiovascular;  Laterality: N/A;    Current Outpatient Prescriptions  Medication Sig Dispense Refill  . aspirin 325 MG tablet Take 1 tablet (325 mg total) by mouth daily.  30 tablet  2  . atorvastatin (LIPITOR) 40 MG tablet Take 1 tablet (40 mg total) by mouth daily at 6 PM.  30 tablet  2  . fexofenadine (ALLEGRA) 180 MG tablet Take 180 mg by mouth daily as needed for allergies. Dose unkn      . ibuprofen (ADVIL,MOTRIN) 600 MG tablet as needed.      Marland Kitchen lisinopril-hydrochlorothiazide (PRINZIDE,ZESTORETIC) 10-12.5 MG per tablet 1 tablet daily.      . Multiple Vitamin (MULTIVITAMIN WITH MINERALS) TABS tablet Take 1 tablet by mouth daily.      Marland Kitchen zolpidem (AMBIEN) 10 MG tablet Take 10 mg by mouth at bedtime.       No current facility-administered medications for this visit.    Allergies as of 06/23/2013  . (No Known Allergies)    Vitals: BP 128/75  Pulse 86  Resp 16  Ht 6' 0.75" (1.848 m)  Wt 235 lb (106.595 kg)  BMI 31.21 kg/m2 Last Weight:  Wt Readings from Last 1 Encounters:  06/23/13 235 lb (106.595 kg)   Last Height:   Ht Readings from Last 1 Encounters:  06/23/13 6' 0.75" (1.848 m)    Physical exam:  General: The patient is awake, alert and appears not in acute distress. The patient is well groomed. Head: Normocephalic, atraumatic. Neck is supple. Mallampati 4 , neck circumference: 16.5 - retrognathia, crowded dental status.  Cardiovascular:  Regular rate and rhythm , without  murmurs or carotid bruit, and without distended neck veins. Respiratory: Lungs are clear to auscultation. Skin:  Without evidence of edema, or rash Trunk: BMI is elevated this  patient  has a normal posture.  Neurologic exam : The patient is awake and alert, oriented to place and time.  Memory subjective described as intact.  There is a normal attention span & concentration ability. Speech is fluent without  dysarthria, dysphonia or aphasia. Mood and affect are appropriate.  Cranial nerves: Pupils are equal and briskly reactive to light.  Funduscopic exam without evidence of pallor or edema. Extraocular movements  in vertical and horizontal planes intact and without nystagmus!  Visual fields by finger perimetry are intact.  Bilateral ptosis.  Hearing to finger rub intact.   Facial sensation intact to fine touch.  Facial motor strength is symmetric and tongue and uvula move midline.  Motor exam:  Normal tone ,muscle bulk and symmetric normal strength in all extremities.  While he sat with closed yes, he leaned towards the left,   Sensory:  Fine touch, pinprick and vibration were tested in all extremities. Proprioception is normal.  Coordination: Rapid alternating movements in the fingers/hands is tested and normal.  Finger-to-nose maneuver tested and normal without evidence of ataxia, dysmetria or tremor.  Gait and station: Patient walks without assistive device . Strength within normal limits. Stance is stable and normal. Steps are unfragmented.   Deep tendon reflexes: in the  upper and lower extremities are symmetric and intact. Babinski maneuver response is downgoing.   Assessment:  After physical and neurologic examination, review of laboratory studies, imaging, neurophysiology testing and pre-existing records, assessment is  1) high risk of OSA , in a young stroke patient with no metabolic or cardiac risk factors.  2) chronic insomnia over 36 month.   Plan:  Treatment plan and additional workup : 2) Start to exchange Ambien for [employer]. SPLIT study, split at 15 and score at 3% . C02 needed.   May use Ambien in the study - please ask him to bring it and take it if needed.

## 2013-06-23 NOTE — Patient Instructions (Addendum)
Sleep Apnea  Sleep apnea is a sleep disorder characterized by abnormal pauses in breathing while you sleep. When your breathing pauses, the level of oxygen in your blood decreases. This causes you to move out of deep sleep and into light sleep. As a result, your quality of sleep is poor, and the system that carries your blood throughout your body (cardiovascular system) experiences stress. If sleep apnea remains untreated, the following conditions can develop:  High blood pressure (hypertension).  Coronary artery disease.  Inability to achieve or maintain an erection (impotence).  Impairment of your thought process (cognitive dysfunction). There are three types of sleep apnea: 1. Obstructive sleep apnea Pauses in breathing during sleep because of a blocked airway. 2. Central sleep apnea Pauses in breathing during sleep because the area of the brain that controls your breathing does not send the correct signals to the muscles that control breathing. 3. Mixed sleep apnea A combination of both obstructive and central sleep apnea. RISK FACTORS The following risk factors can increase your risk of developing sleep apnea:  Being overweight.  Smoking.  Having narrow passages in your nose and throat.  Being of older age.  Being male.  Alcohol use.  Sedative and tranquilizer use.  Ethnicity. Among individuals younger than 35 years, African Americans are at increased risk of sleep apnea. SYMPTOMS   Difficulty staying asleep.  Daytime sleepiness and fatigue.  Loss of energy.  Irritability.  Loud, heavy snoring.  Morning headaches.  Trouble concentrating.  Forgetfulness.  Decreased interest in sex. DIAGNOSIS  In order to diagnose sleep apnea, your caregiver will perform a physical examination. Your caregiver may suggest that you take a home sleep test. Your caregiver may also recommend that you spend the night in a sleep lab. In the sleep lab, several monitors record  information about your heart, lungs, and brain while you sleep. Your leg and arm movements and blood oxygen level are also recorded. TREATMENT The following actions may help to resolve mild sleep apnea:  Sleeping on your side.   Using a decongestant if you have nasal congestion.   Avoiding the use of depressants, including alcohol, sedatives, and narcotics.   Losing weight and modifying your diet if you are overweight. There also are devices and treatments to help open your airway:  Oral appliances. These are custom-made mouthpieces that shift your lower jaw forward and slightly open your bite. This opens your airway.  Devices that create positive airway pressure. This positive pressure "splints" your airway open to help you breathe better during sleep. The following devices create positive airway pressure:  Continuous positive airway pressure (CPAP) device. The CPAP device creates a continuous level of air pressure with an air pump. The air is delivered to your airway through a mask while you sleep. This continuous pressure keeps your airway open.  Nasal expiratory positive airway pressure (EPAP) device. The EPAP device creates positive air pressure as you exhale. The device consists of single-use valves, which are inserted into each nostril and held in place by adhesive. The valves create very little resistance when you inhale but create much more resistance when you exhale. That increased resistance creates the positive airway pressure. This positive pressure while you exhale keeps your airway open, making it easier to breath when you inhale again.  Bilevel positive airway pressure (BPAP) device. The BPAP device is used mainly in patients with central sleep apnea. This device is similar to the CPAP device because it also uses an air pump to deliver   continuous air pressure through a mask. However, with the BPAP machine, the pressure is set at two different levels. The pressure when you  exhale is lower than the pressure when you inhale.  Surgery. Typically, surgery is only done if you cannot comply with less invasive treatments or if the less invasive treatments do not improve your condition. Surgery involves removing excess tissue in your airway to create a wider passage way. Document Released: 01/20/2002 Document Revised: 05/27/2012 Document Reviewed: 06/08/2011 ExitCare Patient Information 2014 [employer]. Insomnia Insomnia is frequent trouble falling and/or staying asleep. Insomnia can be a long term problem or a short term problem. Both are common. Insomnia can be a short term problem when the wakefulness is related to a certain stress or worry. Long term insomnia is often related to ongoing stress during waking hours and/or poor sleeping habits. Overtime, sleep deprivation itself can make the problem worse. Every little thing feels more severe because you are overtired and your ability to cope is decreased. CAUSES   Stress, anxiety, and depression.  Poor sleeping habits.  Distractions such as TV in the bedroom.  Naps close to bedtime.  Engaging in emotionally charged conversations before bed.  Technical reading before sleep.  Alcohol and other sedatives. They may make the problem worse. They can hurt normal sleep patterns and normal dream activity.  Stimulants such as caffeine for several hours prior to bedtime.  Pain syndromes and shortness of breath can cause insomnia.  Exercise late at night.  Changing time zones may cause sleeping problems (jet lag). It is sometimes helpful to have someone observe your sleeping patterns. They should look for periods of not breathing during the night (sleep apnea). They should also look to see how long those periods last. If you live alone or observers are uncertain, you can also be observed at a sleep clinic where your sleep patterns will be professionally monitored. Sleep apnea requires a checkup and treatment. Give  your caregivers your medical history. Give your caregivers observations your family has made about your sleep.  SYMPTOMS   Not feeling rested in the morning.  Anxiety and restlessness at bedtime.  Difficulty falling and staying asleep. TREATMENT   Your caregiver may prescribe treatment for an underlying medical disorders. Your caregiver can give advice or help if you are using alcohol or other drugs for self-medication. Treatment of underlying problems will usually eliminate insomnia problems.  Medications can be prescribed for short time use. They are generally not recommended for lengthy use.  Over-the-counter sleep medicines are not recommended for lengthy use. They can be habit forming.  You can promote easier sleeping by making lifestyle changes such as:  Using relaxation techniques that help with breathing and reduce muscle tension.  Exercising earlier in the day.  Changing your diet and the time of your last meal. No night time snacks.  Establish a regular time to go to bed.  Counseling can help with stressful problems and worry.  Soothing music and white noise may be helpful if there are background noises you cannot remove.  Stop tedious detailed work at least one hour before bedtime. HOME CARE INSTRUCTIONS   Keep a diary. Inform your caregiver about your progress. This includes any medication side effects. See your caregiver regularly. Take note of:  Times when you are asleep.  Times when you are awake during the night.  The quality of your sleep.  How you feel the next day. This information will help your caregiver care for you.    Get out of bed if you are still awake after 15 minutes. Read or do some quiet activity. Keep the lights down. Wait until you feel sleepy and go back to bed.  Keep regular sleeping and waking hours. Avoid naps.  Exercise regularly.  Avoid distractions at bedtime. Distractions include watching television or engaging in any intense or  detailed activity like attempting to balance the household checkbook.  Develop a bedtime ritual. Keep a familiar routine of bathing, brushing your teeth, climbing into bed at the same time each night, listening to soothing music. Routines increase the success of falling to sleep faster.  Use relaxation techniques. This can be using breathing and muscle tension release routines. It can also include visualizing peaceful scenes. You can also help control troubling or intruding thoughts by keeping your mind occupied with boring or repetitive thoughts like the old concept of counting sheep. You can make it more creative like imagining planting one beautiful flower after another in your backyard garden.  During your day, work to eliminate stress. When this is not possible use some of the previous suggestions to help reduce the anxiety that accompanies stressful situations. MAKE SURE YOU:   Understand these instructions.  Will watch your condition.  Will get help right away if you are not doing well or get worse. Document Released: 01/28/2000 Document Revised: 04/24/2011 Document Reviewed: 02/27/2007 ExitCare Patient Information 2014 [employer].  

## 2013-06-24 ENCOUNTER — Ambulatory Visit (INDEPENDENT_AMBULATORY_CARE_PROVIDER_SITE_OTHER): Payer: 59 | Admitting: Neurology

## 2013-06-24 DIAGNOSIS — I639 Cerebral infarction, unspecified: Secondary | ICD-10-CM

## 2013-06-24 DIAGNOSIS — G4733 Obstructive sleep apnea (adult) (pediatric): Secondary | ICD-10-CM

## 2013-06-27 DIAGNOSIS — Z0289 Encounter for other administrative examinations: Secondary | ICD-10-CM

## 2013-07-04 ENCOUNTER — Telehealth: Payer: Self-pay | Admitting: *Deleted

## 2013-07-04 ENCOUNTER — Encounter: Payer: Self-pay | Admitting: Neurology

## 2013-07-04 ENCOUNTER — Ambulatory Visit (INDEPENDENT_AMBULATORY_CARE_PROVIDER_SITE_OTHER): Payer: 59

## 2013-07-04 ENCOUNTER — Ambulatory Visit (INDEPENDENT_AMBULATORY_CARE_PROVIDER_SITE_OTHER): Payer: 59 | Admitting: Neurology

## 2013-07-04 ENCOUNTER — Encounter: Payer: Self-pay | Admitting: *Deleted

## 2013-07-04 VITALS — BP 130/81 | HR 107

## 2013-07-04 DIAGNOSIS — I6350 Cerebral infarction due to unspecified occlusion or stenosis of unspecified cerebral artery: Secondary | ICD-10-CM

## 2013-07-04 DIAGNOSIS — Z0289 Encounter for other administrative examinations: Secondary | ICD-10-CM

## 2013-07-04 NOTE — Telephone Encounter (Signed)
Called and left messages on both home and mobile voicemail for this patient, explained Dr. Brett Fairy and Dr. Leonie Man had met today and reviewed his sleep study information.  He does have severe sleep apnea, but only when he sleeps on his back, therefore he needs to avoid sleeping on his back at all times.  He should turn to his side and use pillows tucked behind him or the "tennis ball method" in order to avoid being flat on his back during sleep.  He did have some snoring in all positions and this may be effectively treated using a dental device or oral appliance.  I explained that this referral could be made for him at the time that all of his results are processed and he will be contacted.  He will also receive a copy of the report with all of the doctor's recommendations for him.    Asked him to call me if he has any questions or concerns or if I may be of any assistance.

## 2013-07-04 NOTE — Patient Instructions (Addendum)
I had a long discussion with the patient and his wife regarding his recent stroke, discuss results of her evaluation and hospital, secondary stroke prevention strategies and answered questions. Continue aspirin for stroke prevention and Lipitor with target LDL goal below 70 mg percent. I advised him to diet, exercise regularly and lose weight. He was given the results preliminary of his sleep study showing only mild sleep apnea and advised to sleep on his sides and see a dentist for dental implant. Return for followup in 3 months. He may return to work on 07/08/13 without restrictions

## 2013-07-04 NOTE — Progress Notes (Signed)
[facility] [address] [place]. [place] [address] [phone]       OFFICE FOLLOW-UP NOTE  [name] Date of Birth:  [birth year] Medical Record Number:  [id number]   HPI: 77 year Caucasian male seen for first office followup visit after hospital admission for stroke on 06/04/13. She presented with sudden onset of dizziness and gait ataxia. 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 Telemetry monitoring did not reveal cardiac arrhythmias. Transthoracic echo showed normal ejection fraction. TEE showed a small PFO but no other obvious cardiac source of embolism. Lower extremity venous Dopplers were negative for DVT. Anticardiolipin antibodies and lupus anticoagulant were negative. Total cholesterol was 179 but LDL was 78 mg percent. Hemoglobin A1c was borderline at 6.0. Patient did quite well and recovered fairly quickly. He was started on aspirin and Lipitor. He states his done well and has tolerated both medicines well without side effects. He feels that he gets tired easily and his golf game is not back to par. He feels he is ready to go back to work soon. He had recently sleep study done which shows mild sleep apnea  and the final results  are yet pending ROS:   14 system review of systems is positive for decreased stamina, tiredness, snoring, mild weakness and dizziness and all other systems negative PMH:  Past Medical History  Diagnosis Date  . Hypertension   . Seasonal allergic rhinitis   . Insomnia     Social History:  History   Social History  . Marital Status: Married    Spouse Name: Preston Nguyen    Number of Children: 1  . Years of Education: Masters   Occupational History  . Not on file.   Social History Main Topics  . Smoking status: Never Smoker   . Smokeless tobacco:  Never Used  . Alcohol Use: Yes     Comment: Rare  . Drug Use: No  . Sexual Activity: Not on file   Other Topics Concern  . Not on file   Social History Narrative   Reports significant stress at work.   Patient is married Preston Nguyen) and lives at home with his wife and his son.   Patient works full-time.   Patient has a Master's degree.   Patient is right-handed.   Patient drinks one cup of coffee daily and 6 sodas per day.    Medications:   Current Outpatient Prescriptions on File Prior to Visit  Medication Sig Dispense Refill  . aspirin 325 MG tablet Take 1 tablet (325 mg total) by mouth daily.  30 tablet  2  . atorvastatin (LIPITOR) 40 MG tablet Take 1 tablet (40 mg total) by mouth daily at 6 PM.  30 tablet  2  . fexofenadine (ALLEGRA) 180 MG tablet Take 180 mg by mouth daily as needed for allergies. Dose unkn      . ibuprofen (ADVIL,MOTRIN) 600 MG tablet as needed.      Marland Kitchen lisinopril-hydrochlorothiazide (PRINZIDE,ZESTORETIC) 10-12.5 MG per tablet 1 tablet daily.      . Multiple Vitamin (MULTIVITAMIN WITH MINERALS) TABS tablet Take 1 tablet by mouth daily.      . zaleplon (SONATA) 10 MG capsule Take 1 capsule (10 mg total) by mouth at bedtime as needed for sleep.  30 capsule  3  . zolpidem (AMBIEN) 10 MG tablet  Take 10 mg by mouth at bedtime.       No current facility-administered medications on file prior to visit.    Allergies:  No Known Allergies  Physical Exam General: mildly obese young Caucasian male, seated, in no evident distress Head: head normocephalic and atraumatic. Orohparynx benign Neck: supple with no carotid or supraclavicular bruits Cardiovascular: regular rate and rhythm, no murmurs Musculoskeletal: no deformity Skin:  no rash/petichiae Vascular:  Normal pulses all extremities There were no vitals filed for this visit. Neurologic Exam Mental Status: Awake and fully alert. Oriented to place and time. Recent and remote memory intact. Attention span,  concentration and fund of knowledge appropriate. Mood and affect appropriate.  Cranial Nerves: Fundoscopic exam reveals sharp disc margins. Pupils equal, briskly reactive to light. Extraocular movements full without nystagmus. Visual fields full to confrontation. Hearing intact. Facial sensation intact. Face, tongue, palate moves normally and symmetrically.  Motor: Normal bulk and tone. Normal strength in all tested extremity muscles. Sensory.: intact to touch and pinprick and vibratory sensation.  Coordination: Rapid alternating movements normal in all extremities. Finger-to-nose and heel-to-shin performed accurately bilaterally. Gait and Station: Arises from chair without difficulty. Stance is normal. Gait demonstrates normal stride length and balance . Able to heel, toe and tandem walk without difficulty.  Reflexes: 1+ and symmetric. Toes downgoing.   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    PLAN:  I had a long discussion with the patient and his wife regarding his recent stroke, discuss results of her evaluation and hospital, secondary stroke prevention strategies and answered questions. Continue aspirin for stroke prevention and Lipitor with target LDL goal below 70 mg percent. I advised him to diet, exercise regularly and lose weight. He was given the preliminary results  of his sleep study showing only mild sleep apnea and advised to sleep on his sides and see a dentist for dental implant. Return for followup in 3 months. He may return to work on 07/08/13 without restrictions        [facility] [address] [place]. [place] [id number]. [phone]       TRANSCRANIAL DOPPLER BUBBLE STUDY   [name] Date of Birth:  07/16/1970 Medical Record Number:   [id number]   Indications: Diagnostic Date of Procedure:  07/04/13 Clinical History:   42 year patient with left cerebellar stroke Technical Description:   Transcranial Doppler Bubble Study was performed at the bedside after taking written informed consent from the patient and explaining risk/benefits. Both middle cerebral arteries were insonated using a headset. And IV line was inserted in the left forearm by the RN using aseptic precautions. Agitated saline injection at rest and after valsalva maneuver did result in multiple high intensity transient signals (HITS).   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   Results were explained to the patient. Questions were answered.I had a long discussion with the patient with regards to the role of patent foramen ovale   and risk of stroke. It is unclear at the present time whether PFO closure leads to better secondary stroke prevention or not. There are ongoing clinical trials which are trying to address this issue. I would recommend antiplatelet therapy for now.    Note: This document was prepared with digital  dictation and possible smart phrase technology. Any transcriptional errors that result from this process are unintentional

## 2013-07-10 ENCOUNTER — Telehealth: Payer: Self-pay | Admitting: *Deleted

## 2013-07-10 ENCOUNTER — Encounter: Payer: Self-pay | Admitting: *Deleted

## 2013-07-10 NOTE — Telephone Encounter (Signed)
Left message for patient regarding sleep study results, asked patient to call me back to discuss results and have questions answered.  Explained that a copy of the sleep study was sent to referring physician and copy of study is coming to them in the mail.  

## 2013-07-10 NOTE — Telephone Encounter (Signed)
Jul 10, 2013   Preston Nguyen [address] [place] [address]   Dear [name],  Enclosed you will find a copy of your sleep study results with your physician's recommendations which are: 1.  Pursue weight loss 2.  Avoid sleeping on your back (your sleep apnea occurs almost exclusively on your back.)  You should use pillows to position yourself so you are unable to turn flat on your back during sleep. 3.  CPAP (Continuous positive airway pressure) therapy can be considered, especially because your sleep apnea changes as your sleep positions change and also because of your medical history. 4.  Oral appliance therapy (dental device worn in the mouth during sleep) can be considered for you as long as you can stay off your back.    Your sleep physician would like to follow up with you to discuss these results and recommendations. Please contact our office to schedule an appointment, you may call the sleep lab directly at [phone] to make this appointment.    If you have any questions or concerns, please don't hesitate to call.  Sincerely,    Averie Hornbaker, RPSGT

## 2013-07-11 ENCOUNTER — Other Ambulatory Visit: Payer: Self-pay | Admitting: Neurology

## 2013-07-11 ENCOUNTER — Encounter: Payer: Self-pay | Admitting: *Deleted

## 2013-07-11 DIAGNOSIS — G4733 Obstructive sleep apnea (adult) (pediatric): Secondary | ICD-10-CM

## 2013-07-11 NOTE — Telephone Encounter (Signed)
Patient called back and after a thorough discussion of his sleep apnea (which is completely positional in nature with supine AHI which is severe and a non-supine AHI which is less than 5 - overall AHI is 7.5 I believe), all recommendations were discussed and explored, and secondary stroke prevention was discussed.  He would like to pursue CPAP therapy as an option for treatment.  He has met his OOP for the year and is interested in getting started.  We discussed auto CPAP as a possible option since he will likely require more pressure when supine than while on his sides.  He will try to stay off his back until he hears back from me but explained he feels like he may be restless and he doesn't really know how he sleeps because he's a little all over.  I explained I will ask Dr. Brett Fairy if she feels he can safely use auto-CPAP and get started on therapy or if he is best served by an in lab CPAP Titration which we would have to schedule in July.  Dr. Brett Fairy, can this patient start on Auto-CPAP or do you want him to have an in lab CPAP Titration?

## 2013-07-14 NOTE — Telephone Encounter (Signed)
I will start auto pap if we have no in lab appointments.

## 2013-08-26 ENCOUNTER — Encounter: Payer: Self-pay | Admitting: Neurology

## 2013-09-09 ENCOUNTER — Telehealth: Payer: Self-pay | Admitting: Neurology

## 2013-09-09 MED ORDER — ASPIRIN 325 MG PO TABS: 325.0000 mg | ORAL_TABLET | Freq: Every day | ORAL | Status: AC

## 2013-09-09 MED ORDER — ATORVASTATIN CALCIUM 40 MG PO TABS: 40.0000 mg | ORAL_TABLET | Freq: Every day | ORAL | Status: DC

## 2013-09-09 NOTE — Telephone Encounter (Signed)
Last OV note from Dr Pearlean BrownieSethi says: Continue aspirin for stroke prevention and Lipitor We have 2 pharmacies listed in the chart.  I called the patient back to verify which one her would like Rx's sent to.  He said sometimes he uses [facility], but prefers these be sent to [facility] instead.  Rx's have been sent.

## 2013-09-09 NOTE — Telephone Encounter (Signed)
Pt was instructed by Pharmacy to call office for Rx refill for atorvastatin (LIPITOR) 40 MG tablet and aspirin 325 MG tablet due Rx has expired.  Please call anytime and advise.

## 2013-09-10 ENCOUNTER — Encounter: Payer: Self-pay | Admitting: Neurology

## 2013-09-10 ENCOUNTER — Ambulatory Visit (INDEPENDENT_AMBULATORY_CARE_PROVIDER_SITE_OTHER): Payer: 59 | Admitting: Neurology

## 2013-09-10 VITALS — BP 117/75 | HR 90 | Resp 16 | Ht 72.5 in | Wt 240.0 lb

## 2013-09-10 DIAGNOSIS — E669 Obesity, unspecified: Secondary | ICD-10-CM | POA: Insufficient documentation

## 2013-09-10 DIAGNOSIS — G4701 Insomnia due to medical condition: Secondary | ICD-10-CM | POA: Insufficient documentation

## 2013-09-10 DIAGNOSIS — G4700 Insomnia, unspecified: Secondary | ICD-10-CM | POA: Insufficient documentation

## 2013-09-10 DIAGNOSIS — G4733 Obstructive sleep apnea (adult) (pediatric): Secondary | ICD-10-CM | POA: Insufficient documentation

## 2013-09-10 DIAGNOSIS — M2610 Unspecified anomaly of jaw-cranial base relationship: Secondary | ICD-10-CM

## 2013-09-10 DIAGNOSIS — M2619 Other specified anomalies of jaw-cranial base relationship: Secondary | ICD-10-CM | POA: Insufficient documentation

## 2013-09-10 DIAGNOSIS — I6322 Cerebral infarction due to unspecified occlusion or stenosis of basilar arteries: Secondary | ICD-10-CM

## 2013-09-10 DIAGNOSIS — I6312 Cerebral infarction due to embolism of basilar artery: Secondary | ICD-10-CM

## 2013-09-10 DIAGNOSIS — Z9989 Dependence on other enabling machines and devices: Secondary | ICD-10-CM

## 2013-09-10 MED ORDER — ZOLPIDEM TARTRATE ER 12.5 MG PO TBCR: 12.5000 mg | EXTENDED_RELEASE_TABLET | Freq: Every evening | ORAL | Status: DC | PRN

## 2013-09-10 NOTE — Progress Notes (Signed)
[facility]   Provider:  Melvyn Novas, M D  Referring Provider: [name], MD Primary Care Physician:  Clydell Hakim, MD  Chief Complaint  Patient presents with  . Follow-up    Room 11  . Sleep consult    HPI:  Preston Nguyen is a 43 y.o. 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  He was seen here as a referral  from Dr. Sherrie Mustache for a sleep consultation on  06-23-13, CD .   Interval history : Preston Nguyen underwent a polysomnographic study on 06-24-13 he had endorsed the Epworth sleepiness score 8 points and the depression inventory at 4 points.  The sleep study showed a mild apnea index the AHI was 7.5 and the RDI 9.4 . Interestingly , his supine AHI was 37.9,  the lowest oxygen saturation was 84% for a total duration of only 7.4 minutes.  There were no significant periodic limb movements none that led to a rales loss. In his heart rate was in regular sinus rhythm. The diagnosis was mild obstructive sleep apnea positional dependent weight loss was the first advice to treat obstructive sleep apnea the patient also has a mild retrognathia. The second advice was to use CPAP, but avoid sleeping on the back while using it for at least a trial period. Given his history of stroke,  CPAP was a significant contributor to lowering his stroke risk factors.   He was given an auto- titration set between 4 cm water and  Maximum 10 cm water pressure and used it for the last 48 days  : there is a 94% compliance.  His AHI is now 1.9 -quite reduced in comparison to his baseline. He does not have significant air leakage. His 95th percentile pressure is 9.6 cm water. He states he is able to sleep on the side by using CPAP. He has been under enormous stress at work and feels he may have not been able to sustain if he wouldn't have better, sounder sleep.   He had no complaint of  nocturia or restless sleep, those changes. He has not heard from the DME , [facility], about his costs to the Therapy.    Interval visit with Dr Pearlean Brownie:  07-06-13 , RV in 3 month . He is advised to lose weight and was told that a dental device may work alone for his kind of apnea, unfortunatley his insurance would not cover this therapy.      Last visit.   Preston Nguyen has been recently admitted to the stroke team at [facility]. He stated that on 06-04-13 he awoke feeling normal by just leaving the house she had an onset of date ataxia and imbalance he fell asleep left as he would drift off  Or fall towards his left side. He was unbalanced, had vertigo. Then he developed severe nausea and vomiting and EMS was called for the patient. He was initially seen at [facility] and was sent here for further stroke evaluation from the local [facility].  An MRI confirmed that he suffered a cerebellar stroke. Until then , the patient had been very healthy and was not on regular prescription medications. His blood cell counts and electrolyte panels were normal , his cholesterol was not elevated, but triglycerides were high. He admitted to drinking 6-7 Soda cans a day. He drinks coffee in the morning. He had eaten before this blood  draw .   He has been insomniac for several years, 2-3 years now taking Ambien. The patient has no trouble going to sleep, but after 2 hours would be awake without Ambien. With Ambien he sleeps 6-8 hours, mild  hang over effect.  He goes to bed by 9.30 and watches TV until 11 PM, than will go to sleep with the help of Ambien. He will have 6 hours of sleep. Wakes up with headaches. Without the need of an alarm, he rises spontaneously. Even in college , he was an early rises.  He rarely ever naps.   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   The patient has significant retrognathia, his wife witnessed him snoring and having apnea.  She sleeps in a separate bedroom. He  has not been back to work since his stroke 21 days ago.     Review of Systems: Out of a complete 14 system review, the patient complains of only the following symptoms, and all other reviewed systems are negative. Hypertension, vertigo, ataxia. Snoring, fatigue ,insomnia, rhinitis. He is still dizzy ( sice the stroke)  when bending down- vertigo. He feels better when standing erect.  Epworth 2 , FSS 24 on CPAP.    History   Social History  . Marital Status: Married    Spouse Name: [name]    Number of Children: 1  . Years of Education: Masters   Occupational History  . Not on file.   Social History Main Topics  . Smoking status: Never Smoker   . Smokeless tobacco: Never Used  . Alcohol Use: Yes     Comment: Rare  . Drug Use: No  . Sexual Activity: Not on file   Other Topics Concern  . Not on file   Social History Narrative   Reports significant stress at work.   Patient is married [name]) and lives at home with his wife and his son.   Patient works full-time.   Patient has a Master's degree.   Patient is right-handed.   Patient drinks one cup of coffee daily and 3-4 sodas per day.    Family History  Problem Relation Age of Onset  . Stroke Mother     aged 41s  . Stroke Maternal Grandmother   . Coronary artery disease Mother     age 78s  . Diabetes Mother   . Diabetes Maternal Grandmother   . Diabetes Paternal Grandmother   . Breast cancer Paternal Grandmother   . Cancer Paternal Grandfather   . Heart Problems Maternal Grandfather     Past Medical History  Diagnosis Date  . Hypertension   . Seasonal allergic rhinitis   . Insomnia     Past Surgical History  Procedure Laterality Date  . Wisdom tooth extraction  05/2013  . Hernia repair      "when he was younger"  . Tee without cardioversion N/A 06/06/2013    Procedure: TRANSESOPHAGEAL ECHOCARDIOGRAM (TEE);  Surgeon: Quintella Reichert, MD;  [facility] ENDOSCOPY;  Service: Cardiovascular;  Laterality: N/A;     Current Outpatient Prescriptions  Medication Sig Dispense Refill  . aspirin 325 MG tablet Take 1 tablet (325 mg total) by mouth daily.  90 tablet  1  . atorvastatin (LIPITOR) 40 MG tablet Take 1 tablet (40 mg total) by mouth daily at 6 PM.  90 tablet  1  . fexofenadine (ALLEGRA) 180 MG tablet Take 180 mg by mouth daily as needed for allergies. Dose unkn      .  ibuprofen (ADVIL,MOTRIN) 600 MG tablet as needed.      Marland Kitchen. lisinopril-hydrochlorothiazide (PRINZIDE,ZESTORETIC) 10-12.5 MG per tablet 1 tablet daily.      . Multiple Vitamin (MULTIVITAMIN WITH MINERALS) TABS tablet Take 1 tablet by mouth daily.      . zaleplon (SONATA) 10 MG capsule Take 1 capsule (10 mg total) by mouth at bedtime as needed for sleep.  30 capsule  3  . zolpidem (AMBIEN) 10 MG tablet Take 10 mg by mouth at bedtime.       No current facility-administered medications for this visit.    Allergies as of 09/10/2013  . (No Known Allergies)    Vitals: BP 117/75  Pulse 90  Resp 16  Ht 6' 0.5" (1.842 m)  Wt 240 lb (108.863 kg)  BMI 32.08 kg/m2 Last Weight:  Wt Readings from Last 1 Encounters:  09/10/13 240 lb (108.863 kg)   Last Height:   Ht Readings from Last 1 Encounters:  09/10/13 6' 0.5" (1.842 m)    Physical exam:  General: The patient is awake, alert and appears not in acute distress. The patient is well groomed. Full facial hair, the beard is trimmed. He prefers a FFM.  Head: Normocephalic, atraumatic. Neck is supple. Mallampati 2  , neck circumference: 18.5  - retrognathia, crowded dental status.  Cardiovascular:  Regular rate and rhythm , without  murmurs or carotid bruit, and without distended neck veins. Respiratory: Lungs are clear to auscultation. Skin:  Without evidence of edema, or rash Trunk: BMI is elevated this  patient  has a normal posture.  Neurologic exam : The patient is awake and alert, oriented to place and time.  Memory subjective described as intact.  There is a normal attention  span & concentration ability. Speech is fluent without dysarthria, dysphonia or aphasia. Mood and affect are appropriate.  Cranial nerves: Pupils are equal and briskly reactive to light.  Funduscopic exam without evidence of pallor or edema. Extraocular movements  in vertical and horizontal planes intact and without nystagmus!  Visual fields by finger perimetry are intact.  Good peripheral vision.  Bilateral ptosis. Right more than left- he started driving again. No diplopia .  Hearing to finger rub intact.   Facial sensation intact to fine touch.  Facial motor strength is symmetric and tongue and uvula move midline.  Motor exam:   Normal tone ,muscle bulk and symmetric normal strength in all extremities.  While he sat with closed yes, he leaned towards the left,   Sensory:  Fine touch, pinprick and vibration were tested in all extremities. Proprioception is normal.  Coordination: Rapid alternating movements in the fingers/hands is tested and normal.  Finger-to-nose maneuver tested and normal without evidence of ataxia, dysmetria or tremor.  Gait and station: Patient walks without assistive device . Strength within normal limits.  Stance is stable and normal. Steps are unfragmented.   Deep tendon reflexes: in the  upper and lower extremities are symmetric and intact.  Babinski maneuver response is downgoing.   Assessment:  After physical and neurologic examination, review of laboratory studies, imaging, neurophysiology testing and pre-existing records, assessment is  1) high risk of OSA , in a young stroke patient with no metabolic or cardiac risk factors.  He had only mild and REM accentuate , supine accentuated AHI, but was able to use CPAP and  is highly compliant.  2) chronic insomnia over 36 month. Ambien works through the night, belsomra did not work as long. He would wake up at  3 or 4 AM , unable to re-initiate sleep.  3) generic extended release Ambien was not tried, iIwill  order a 30 day supply.  Plan:  Treatment plan and additional workup : 2) Start to exchange Ambien for [employer].

## 2013-09-10 NOTE — Patient Instructions (Signed)
Sleep Apnea  Sleep apnea is a sleep disorder characterized by abnormal pauses in breathing while you sleep. When your breathing pauses, the level of oxygen in your blood decreases. This causes you to move out of deep sleep and into light sleep. As a result, your quality of sleep is poor, and the system that carries your blood throughout your body (cardiovascular system) experiences stress. If sleep apnea remains untreated, the following conditions can develop:  High blood pressure (hypertension).  Coronary artery disease.  Inability to achieve or maintain an erection (impotence).  Impairment of your thought process (cognitive dysfunction). There are three types of sleep apnea: 1. Obstructive sleep apnea--Pauses in breathing during sleep because of a blocked airway. 2. Central sleep apnea--Pauses in breathing during sleep because the area of the brain that controls your breathing does not send the correct signals to the muscles that control breathing. 3. Mixed sleep apnea--A combination of both obstructive and central sleep apnea. RISK FACTORS The following risk factors can increase your risk of developing sleep apnea:  Being overweight.  Smoking.  Having narrow passages in your nose and throat.  Being of older age.  Being male.  Alcohol use.  Sedative and tranquilizer use.  Ethnicity. Among individuals younger than 35 years, African Americans are at increased risk of sleep apnea. SYMPTOMS   Difficulty staying asleep.  Daytime sleepiness and fatigue.  Loss of energy.  Irritability.  Loud, heavy snoring.  Morning headaches.  Trouble concentrating.  Forgetfulness.  Decreased interest in sex. DIAGNOSIS  In order to diagnose sleep apnea, your caregiver will perform a physical examination. Your caregiver may suggest that you take a home sleep test. Your caregiver may also recommend that you spend the night in a sleep lab. In the sleep lab, several monitors record  information about your heart, lungs, and brain while you sleep. Your leg and arm movements and blood oxygen level are also recorded. TREATMENT The following actions may help to resolve mild sleep apnea:  Sleeping on your side.   Using a decongestant if you have nasal congestion.   Avoiding the use of depressants, including alcohol, sedatives, and narcotics.   Losing weight and modifying your diet if you are overweight. There also are devices and treatments to help open your airway:  Oral appliances. These are custom-made mouthpieces that shift your lower jaw forward and slightly open your bite. This opens your airway.  Devices that create positive airway pressure. This positive pressure "splints" your airway open to help you breathe better during sleep. The following devices create positive airway pressure:  Continuous positive airway pressure (CPAP) device. The CPAP device creates a continuous level of air pressure with an air pump. The air is delivered to your airway through a mask while you sleep. This continuous pressure keeps your airway open.  Nasal expiratory positive airway pressure (EPAP) device. The EPAP device creates positive air pressure as you exhale. The device consists of single-use valves, which are inserted into each nostril and held in place by adhesive. The valves create very little resistance when you inhale but create much more resistance when you exhale. That increased resistance creates the positive airway pressure. This positive pressure while you exhale keeps your airway open, making it easier to breath when you inhale again.  Bilevel positive airway pressure (BPAP) device. The BPAP device is used mainly in patients with central sleep apnea. This device is similar to the CPAP device because it also uses an air pump to deliver continuous air pressure   through a mask. However, with the BPAP machine, the pressure is set at two different levels. The pressure when you  exhale is lower than the pressure when you inhale.  Surgery. Typically, surgery is only done if you cannot comply with less invasive treatments or if the less invasive treatments do not improve your condition. Surgery involves removing excess tissue in your airway to create a wider passage way. Document Released: 01/20/2002 Document Revised: 05/27/2012 Document Reviewed: 06/08/2011 [facility] Patient Information 2015 [place] [place]. This information is not intended to replace advice given to you by your health care provider. Make sure you discuss any questions you have with your health care provider. Insomnia Insomnia is frequent trouble falling and/or staying asleep. Insomnia can be a long term problem or a short term problem. Both are common. Insomnia can be a short term problem when the wakefulness is related to a certain stress or worry. Long term insomnia is often related to ongoing stress during waking hours and/or poor sleeping habits. Overtime, sleep deprivation itself can make the problem worse. Every little thing feels more severe because you are overtired and your ability to cope is decreased. CAUSES   Stress, anxiety, and depression.  Poor sleeping habits.  Distractions such as TV in the bedroom.  Naps close to bedtime.  Engaging in emotionally charged conversations before bed.  Technical reading before sleep.  Alcohol and other sedatives. They may make the problem worse. They can hurt normal sleep patterns and normal dream activity.  Stimulants such as caffeine for several hours prior to bedtime.  Pain syndromes and shortness of breath can cause insomnia.  Exercise late at night.  Changing time zones may cause sleeping problems (jet lag). It is sometimes helpful to have someone observe your sleeping patterns. They should look for periods of not breathing during the night (sleep apnea). They should also look to see how long those periods last. If you live alone or observers  are uncertain, you can also be observed at a sleep clinic where your sleep patterns will be professionally monitored. Sleep apnea requires a checkup and treatment. Give your caregivers your medical history. Give your caregivers observations your family has made about your sleep.  SYMPTOMS   Not feeling rested in the morning.  Anxiety and restlessness at bedtime.  Difficulty falling and staying asleep. TREATMENT   Your caregiver may prescribe treatment for an underlying medical disorders. Your caregiver can give advice or help if you are using alcohol or other drugs for self-medication. Treatment of underlying problems will usually eliminate insomnia problems.  Medications can be prescribed for short time use. They are generally not recommended for lengthy use.  Over-the-counter sleep medicines are not recommended for lengthy use. They can be habit forming.  You can promote easier sleeping by making lifestyle changes such as:  Using relaxation techniques that help with breathing and reduce muscle tension.  Exercising earlier in the day.  Changing your diet and the time of your last meal. No night time snacks.  Establish a regular time to go to bed.  Counseling can help with stressful problems and worry.  Soothing music and white noise may be helpful if there are background noises you cannot remove.  Stop tedious detailed work at least one hour before bedtime. HOME CARE INSTRUCTIONS   Keep a diary. Inform your caregiver about your progress. This includes any medication side effects. See your caregiver regularly. Take note of:  Times when you are asleep.  Times when you are awake  Times when you are awake during the night. °¨ The quality of your sleep. °¨ How you feel the next day. °This information will help your caregiver care for you. °· Get out of bed if you are still awake after 15 minutes. Read or do some quiet activity. Keep the lights down. Wait until you feel sleepy and go back to  bed. °· Keep regular sleeping and waking hours. Avoid naps. °· Exercise regularly. °· Avoid distractions at bedtime. Distractions include watching television or engaging in any intense or detailed activity like attempting to balance the household checkbook. °· Develop a bedtime ritual. Keep a familiar routine of bathing, brushing your teeth, climbing into bed at the same time each night, listening to soothing music. Routines increase the success of falling to sleep faster. °· Use relaxation techniques. This can be using breathing and muscle tension release routines. It can also include visualizing peaceful scenes. You can also help control troubling or intruding thoughts by keeping your mind occupied with boring or repetitive thoughts like the old concept of counting sheep. You can make it more creative like imagining planting one beautiful flower after another in your backyard garden. °· During your day, work to eliminate stress. When this is not possible use some of the previous suggestions to help reduce the anxiety that accompanies stressful situations. °MAKE SURE YOU:  °· Understand these instructions. °· Will watch your condition. °· Will get help right away if you are not doing well or get worse. °Document Released: 01/28/2000 Document Revised: 04/24/2011 Document Reviewed: 02/27/2007 °ExitCare® Patient Information ©2015 [employer] LLC. This information is not intended to replace advice given to you by your health care provider. Make sure you discuss any questions you have with your health care provider. ° °

## 2013-09-18 NOTE — Telephone Encounter (Signed)
Noted  

## 2013-10-22 NOTE — Telephone Encounter (Signed)
Patient was seen and had all testing done.

## 2013-10-29 ENCOUNTER — Encounter: Payer: Self-pay | Admitting: Neurology

## 2013-11-28 HISTORY — PX: VASECTOMY: SHX75

## 2014-02-14 ENCOUNTER — Other Ambulatory Visit: Payer: Self-pay | Admitting: Neurology

## 2014-03-13 ENCOUNTER — Ambulatory Visit: Payer: 59 | Admitting: Adult Health

## 2014-04-06 ENCOUNTER — Telehealth: Payer: Self-pay | Admitting: Neurology

## 2014-04-07 NOTE — Telephone Encounter (Signed)
Patient returned call and stated he would like to exchange Ambien for Sonata.  Requesting 90 day supply Rx forwarded to [facility] on [address].  Please call cell # [phone] and advise.

## 2014-04-07 NOTE — Telephone Encounter (Signed)
[facility].  Okay to change?  Please advise.  Thank you.

## 2014-04-07 NOTE — Telephone Encounter (Signed)
Last OV note says: exchange Ambien for Sonata.  I called the patient to clarify, got no answer.  Left message.

## 2014-04-08 NOTE — Telephone Encounter (Signed)
I called the patient back.  Got no answer.  Left message. 

## 2014-04-08 NOTE — Telephone Encounter (Signed)
OK [facility]

## 2014-04-13 ENCOUNTER — Telehealth: Payer: Self-pay | Admitting: Neurology

## 2014-04-13 NOTE — Telephone Encounter (Signed)
Pt states he has been waiting on this Rx zaleplon (SONATA) 10 MG capsule since 04/08/14.  [facility] after [facility]  Please advise.

## 2014-04-13 NOTE — Telephone Encounter (Signed)
Rx has been re-faxed.  I called the pharmacy and spoke with Missy.  She verified they did receive this Rx, and it is in process.  I called the patient back.  He is aware.

## 2014-06-06 NOTE — Discharge Summary (Signed)
PATIENT NAME:  Preston Nguyen, Preston Nguyen MR#:  [id number] DATE OF BIRTH:  [birth year]  DATE OF ADMISSION:  06/04/2013 DATE OF TRANSFER TO [place]:  06/04/2013  ADMISSION DIAGNOSIS:  Gait instability, dizziness, nausea, vomiting,   DISCHARGE DIAGNOSES: 1.  Nausea, vomiting, gait instability due to an acute nonhemorrhagic left inferior cerebellar infarct.  2.  Hypertension.  3.  Hyperglycemia.  4.  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   CONSULTANTS: None.   LABORATORY, DIAGNOSTIC AND RADIOLOGICAL DATA:  MRI of the brain showed acute to subacute nonhemorrhagic left inferior cerebellar infarct, slower occluded flow within the left vertebral artery. No evidence of significant supratentorial disease. CT scan of the head was negative. Bilateral ultrasound, carotid, showed mild plaque bilaterally, borderline elevation of the velocities in the right internal carotid artery suggesting a stenosis in the 50% range, no significant stenosis noted on the left. Chest x-ray showed possible early bronchopneumonia in the retrocardiac portion of the left lower lobe, consider a PA and lateral chest x-ray.   HOSPITAL COURSE: Please refer to H and P done by me earlier. The patient is a 44 year old white male with history of hypertension, who was in his usual state of health, who earlier this morning, before going to work, started having gait instability. Whenever he started walking, he would lean towards the left side. He was also very dizzy and nauseated and throwing up. Due to these symptoms, he came to the ED. He was admitted. He was seen and evaluated, had a CT scan of the head which was negative. The patient had an MRI of the brain which showed acute left inferior cerebellar infarcts were noted. I explained these findings to the family and the patient. He was waiting for a bed to go upstairs here and then the [facility]. I discussed the  case with the on-call neurologist and then I also spoke to the hospitalist on call,for the transfer of the patient. They have graciously accepted the patient's transfer. Currently he is stable for transfer.   MEDICATIONS THAT HE WAS ON HERE: Aspirin 81 mg 1 tab p.o. daily, atorvastatin 20 daily, Lovenox 40 mg subcutaneous q.24, lisinopril 10 daily.   NOTE: Time spent, this is in addition to the initial H and P.  Time spent on arranging transfer was 60 minutes.   ____________________________ Lacie ScottsShreyang H. Allena KatzPatel, MD shp:cs D: 06/04/2013 19:03:50 ET T: 06/04/2013 19:14:18 ET JOB#: [id number]  cc: Dora Clauss H. Allena KatzPatel, MD, <Dictator> Charise CarwinSHREYANG H Maylee Bare MD ELECTRONICALLY SIGNED 06/05/2013 13:52

## 2014-06-06 NOTE — H&P (Signed)
PATIENT NAME:  Preston Nguyen, Preston Nguyen MR#:  [id number] DATE OF BIRTH:  06-Mar-1970  DATE OF ADMISSION:  06/04/2013  PRIMARY CARE PROVIDER: Demetrios Isaacs. Sherrie Mustache, MD  EMERGENCY DEPARTMENT REFERRING PHYSICIAN: Randon Goldsmith. Lord, MD  CHIEF COMPLAINT: Gait instability, dizziness, nausea, vomiting.   HISTORY OF PRESENT ILLNESS: The patient is a 44 year old white male with history of hypertension, who was in his usual state of health. States that he had a wisdom tooth removed about 2 weeks ago and has been taking ibuprofen and hydrocodone/acetaminophen. He reports taking one dose last night, and then this morning he woke up and he was fine. He ate his breakfast and then was trying to go to work when all of a sudden he started becoming very dizzy and started having difficulty with walking. Whenever he would walk, he would pull towards the left side. He started having severe nausea, vomiting. Also started becoming diaphoretic, so EMS was called. When they arrived, his blood sugars were elevated in the 220s. The patient in the ED had issues with intermittent heart rate being elevated with sinus tachycardia. Therefore, we were asked to admit him for possible cerebellar CVA. The patient reports that nothing like this has ever happened to him. He is not having any difficulty with vision. No nystagmus. Denies any chest pain, shortness of breath now. He did have shortness of breath when he was having the acute episode. He denies any abdominal pain, nausea, vomiting or diarrhea. Denies any urinary symptoms.   PAST MEDICAL HISTORY: Significant for hypertension.   PAST SURGICAL HISTORY: History of surgery for left finger injury as a child, had inguinal hernia repair as a child.   ALLERGIES: None.   CURRENT MEDICATIONS AT HOME: He is on lisinopril 10 daily, Ambien 10 at bedtime, acetaminophen/hydrocodone 325/5 q.6 p.r.n. for pain.   SOCIAL HISTORY: Does not smoke. Drinks occasionally. No drug use.   FAMILY HISTORY:  Positive for coronary artery disease, hypertension, CVA in the past.   REVIEW OF SYSTEMS:    CONSTITUTIONAL: Denies any fevers, fatigue, weakness, pain, weight loss, weight gain.  EYES: No blurred or double vision. No pain. No redness. No inflammation. No glaucoma. No cataracts.  EARS, NOSE, THROAT: No tinnitus. No ear pain. No hearing loss. No seasonal or year-round allergies. No epistaxis. No difficulty swallowing.  RESPIRATORY: Denies any cough, wheezing, hemoptysis. No dyspnea. No asthma.  CARDIOVASCULAR: Denies any chest pain, orthopnea, edema or arrhythmia.  GASTROINTESTINAL: No nausea, vomiting, diarrhea. No abdominal pain. No hematemesis. No melena. No ulcer. No GERD. No IBS.  GENITOURINARY: Denies any dysuria, hematuria, renal calculus or frequency.  ENDOCRINE: Denies any polyuria, nocturia or thyroid problems.  HEMATOLOGIC AND LYMPHATIC: Denies anemia, easy bruisability or bleeding.  SKIN: No acne. No rash. No changes in hair, mole or skin.  MUSCULOSKELETAL: Denies any pain in neck, back or shoulder.  NEUROLOGIC: No numbness, CVA, TIA, seizures.  PSYCHIATRIC: No anxiety, insomnia or ADD.   PHYSICAL EXAMINATION: VITAL SIGNS: Temperature 97.6, pulse 98, respirations 20, blood pressure 147/69.  GENERAL: The patient is a well-developed male in no acute distress.  HEENT: Head atraumatic, normocephalic. Pupils equally round, reactive to light and accommodation. There is no conjunctival pallor, no scleral icterus. Nasal exam shows no drainage or ulceration. Oropharynx is clear without any exudate.  NECK: Supple without any JVD.  CARDIOVASCULAR: Regular rate and rhythm. No murmurs, rubs, clicks. PMI is not displaced.  ABDOMEN: Soft, nondistended.  EXTREMITIES: No clubbing, cyanosis or edema.  SKIN: No rash.  LYMPHATICS: No  lymph nodes palpable.  VASCULAR: Good DP, PT pulses.  PSYCHIATRIC: Not anxious or depressed.  NEUROLOGIC: Awake, alert, oriented x 3. Cranial nerves II through XII  grossly intact. There is no nystagmus. Strength is 5 out of 5 in all 4 extremities. Reflexes 2+. Babinski's downgoing. He has no abnormality with finger-to-nose test.   LABORATORY, DIAGNOSTIC, AND RADIOLOGICAL DATA: EKG: Normal sinus rhythm without any ST-T wave changes. Labs: Glucose 190, BUN 22, creatinine 1.17, sodium 138, potassium 3.6, chloride 105; CO2 is 27, calcium 8.6. LFTs are normal. Troponin less than 0.02. WBC 8.8, hemoglobin 15.4, platelet count 263. CT scan of the head without contrast shows no acute abnormality. Chest x-ray shows possible early bronchopneumonia in the retrocardiac portion.   ASSESSMENT AND PLAN: The patient is a 44 year old white male with history of hypertension, who presents with gait instability, nausea, vomiting, dizziness. 1.  Ataxia, dizziness: Possible cerebellar cerebrovascular accident. At this time, will need to admit him with frequent neuro checks. Start him on aspirin. We will get an MRI of the brain, carotid Dopplers, and echocardiogram of the heart. Further work-up depending on what we find on the MRI.  2.  Intermittent sinus tachycardia: We will check telemetry, get echocardiogram of the heart, and will check a TSH.  3.  Hypertension: Continue lisinopril.  4.  Hyperglycemia: We will check a hemoglobin A1c and make sure the patient is not diabetic.  5.  Miscellaneous: We will do Lovenox for deep vein thrombosis prophylaxis.   TIME SPENT: On this patient's H and P is 50 minutes.   ____________________________ Lacie ScottsShreyang H. Allena KatzPatel, MD shp:jcm D: 06/04/2013 15:54:12 ET T: 06/04/2013 16:59:41 ET JOB#: [id number]  cc: Akane Tessier H. Allena KatzPatel, MD, <Dictator> Charise CarwinSHREYANG H Ajahni Nay MD ELECTRONICALLY SIGNED 06/05/2013 13:52

## 2014-10-10 ENCOUNTER — Other Ambulatory Visit: Payer: Self-pay | Admitting: Neurology

## 2015-02-16 DIAGNOSIS — R51 Headache: Secondary | ICD-10-CM

## 2015-02-16 DIAGNOSIS — R519 Headache, unspecified: Secondary | ICD-10-CM | POA: Insufficient documentation

## 2015-02-16 DIAGNOSIS — I639 Cerebral infarction, unspecified: Secondary | ICD-10-CM | POA: Insufficient documentation

## 2015-02-17 ENCOUNTER — Telehealth: Payer: Self-pay | Admitting: Family Medicine

## 2015-02-17 ENCOUNTER — Encounter: Payer: Self-pay | Admitting: Family Medicine

## 2015-02-17 ENCOUNTER — Ambulatory Visit (INDEPENDENT_AMBULATORY_CARE_PROVIDER_SITE_OTHER): Payer: PRIVATE HEALTH INSURANCE | Admitting: Family Medicine

## 2015-02-17 VITALS — BP 110/70 | HR 84 | Temp 98.0°F | Resp 16 | Ht 72.5 in | Wt 260.0 lb

## 2015-02-17 DIAGNOSIS — Z0000 Encounter for general adult medical examination without abnormal findings: Secondary | ICD-10-CM

## 2015-02-17 DIAGNOSIS — E669 Obesity, unspecified: Secondary | ICD-10-CM | POA: Diagnosis not present

## 2015-02-17 DIAGNOSIS — I10 Essential (primary) hypertension: Secondary | ICD-10-CM

## 2015-02-17 DIAGNOSIS — E785 Hyperlipidemia, unspecified: Secondary | ICD-10-CM | POA: Diagnosis not present

## 2015-02-17 DIAGNOSIS — G4701 Insomnia due to medical condition: Secondary | ICD-10-CM | POA: Diagnosis not present

## 2015-02-17 DIAGNOSIS — E119 Type 2 diabetes mellitus without complications: Secondary | ICD-10-CM

## 2015-02-17 DIAGNOSIS — G4733 Obstructive sleep apnea (adult) (pediatric): Secondary | ICD-10-CM

## 2015-02-17 DIAGNOSIS — Z9989 Dependence on other enabling machines and devices: Secondary | ICD-10-CM

## 2015-02-17 LAB — POCT GLYCOSYLATED HEMOGLOBIN (HGB A1C)
ESTIMATED AVERAGE GLUCOSE: 197
HEMOGLOBIN A1C: 8.5

## 2015-02-17 MED ORDER — METFORMIN HCL ER 500 MG PO TB24: 500.0000 mg | ORAL_TABLET | Freq: Every day | ORAL | Status: DC

## 2015-02-17 MED ORDER — ATORVASTATIN CALCIUM 40 MG PO TABS: 40.0000 mg | ORAL_TABLET | Freq: Every day | ORAL | Status: DC

## 2015-02-17 NOTE — Telephone Encounter (Signed)
FYI--Pt refused appointment to opthalmology

## 2015-02-17 NOTE — Progress Notes (Signed)
Patient: Preston Nguyen, Male    DOB: 04-25-1970, 45 y.o.   MRN: [id number] Visit Date: 02/17/2015  Today's Provider: [name], MD   Chief Complaint  Patient presents with  . Annual Exam  . Hypertension    follow up  . Stress    follow up  . Insomnia    follow up   Subjective:    Annual physical exam [name] is a 45 y.o. male who presents today for health maintenance and complete physical. He feels well. He reports exercising 2 days a week at the gym. He reports he is sleeping fairly well.  -----------------------------------------------------------------  Hypertension, follow-up:  BP Readings from Last 3 Encounters:  02/17/15 110/70  09/10/13 117/75  07/04/13 130/81    He was last seen for hypertension 1 years ago.  BP at that visit was 146/98. Management since that visit includes no changes. He reports good compliance with treatment. He is not having side effects.  He is exercising. He is adherent to low salt diet.   Outside blood pressures are 130's/ 80-90. He is experiencing none.  Patient denies chest pain, chest pressure/discomfort, claudication, dyspnea, exertional chest pressure/discomfort, fatigue, irregular heart beat, lower extremity edema, near-syncope, orthopnea, palpitations, paroxysmal nocturnal dyspnea, syncope and tachypnea.   Cardiovascular risk factors include hypertension and male gender.  Use of agents associated with hypertension: NSAIDS.     Weight trend: increasing steadily Wt Readings from Last 3 Encounters:  02/17/15 260 lb (117.935 kg)  09/10/13 240 lb (108.863 kg)  06/23/13 235 lb (106.595 kg)    Current diet: in general, an "unhealthy" diet  ------------------------------------------------------------------------   Insomnia  He presents today for evaluation of insomnia. Insomnia is getting better. Last office visit was 1 year ago. Changes made at that visit includes changing Ambien to ER due to  the regular  Strength wearing off to early.    --------------------------------------------------------------------   Hyperglycemia, Follow-up:   Lab Results  Component Value Date   HGBA1C 8.5 02/17/2015   HGBA1C 6.0* 06/05/2013   GLUCOSE 141* 06/05/2013   GLUCOSE 190* 06/04/2013    Last seen for for this 1 years ago.  Management since then includes no changes. Current symptoms include polyuria and visual disturbances and have been worsening.  Weight trend: increasing steadily Prior visit with dietician: no Current diet: unhealthy Current exercise:cardio 2 times a week  He had labs done at work in October with HgbA1c of 8.1. He has since been trying to lose weight and exercising more.   Pertinent Labs:    Component Value Date/Time   CHOL 149 06/05/2013 0522   TRIG 155* 06/05/2013 0522   CHOLHDL 3.7 06/05/2013 0522   CREATININE 1.01 06/05/2013 0012   CREATININE 1.17 06/04/2013 0948   CREATININE 1.1 11/15/2012    Wt Readings from Last 3 Encounters:  02/17/15 260 lb (117.935 kg)  09/10/13 240 lb (108.863 kg)  06/23/13 235 lb (106.595 kg)     Review of Systems  Constitutional: Negative for fever, chills and appetite change.  Respiratory: Negative for chest tightness, shortness of breath and wheezing.   Cardiovascular: Negative for chest pain and palpitations.  Gastrointestinal: Negative for nausea, vomiting and abdominal pain.  Endocrine: Positive for polyuria. Negative for cold intolerance, heat intolerance, polydipsia and polyphagia.  Neurological: Positive for light-headedness.    Social History      He  reports that he has never smoked. He has never used smokeless tobacco. He reports that he drinks  alcohol. He reports that he does not use illicit drugs.       Social History   Social History  . Marital Status: Married    Spouse Name: [name]  . Number of Children: 1  . Years of Education: Masters   Occupational History  . Pharmacist, hospital    Social  History Main Topics  . Smoking status: Never Smoker   . Smokeless tobacco: Never Used  . Alcohol Use: 0.0 oz/week    0 Standard drinks or equivalent per week     Comment: occasionally  . Drug Use: No  . Sexual Activity: Not Asked   Other Topics Concern  . None   Social History Narrative   Reports significant stress at work.   Patient is married [name]) and lives at home with his wife and his son.   Patient works full-time.   Patient has a Master's degree.   Patient is right-handed.   Patient drinks one cup of coffee daily and 3-4 sodas per day.    Past Medical History  Diagnosis Date  . Hypertension   . Seasonal allergic rhinitis   . Insomnia   . Acute stress disorder   . Insomnia   . History of chicken pox      Patient Active Problem List   Diagnosis Date Noted  . Cryptogenic stroke (HCC) 02/16/2015  . Headache 02/16/2015  . Obesity (BMI 30-39.9) 09/10/2013  . Insomnia due to medical condition 09/10/2013  . OSA on CPAP 09/10/2013  . 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 History of stroke 06/05/2013  . Essential hypertension, benign 06/04/2013  . Hyperglycemia 06/04/2013  . Acute stress disorder 07/19/2006  . Fam hx-ischem heart disease 05/29/2006    Past Surgical History  Procedure Laterality Date  . Wisdom tooth extraction  05/2013  . Tee without cardioversion N/A 06/06/2013    Procedure: TRANSESOPHAGEAL ECHOCARDIOGRAM (TEE);  Surgeon: Quintella Reichert, MD;  Location: [facility] ENDOSCOPY;  Service: Cardiovascular;  Laterality: N/A;  . Vasectomy  11/28/2013  . Hernia repair  1970's    Inguinal hernia repair. "when he was younger"    Family History        Family Status  Relation Status Death Age  . Mother Alive   . Maternal Grandmother Alive   . Paternal Grandmother Deceased   . Paternal Grandfather Deceased     myeloma  . Father Deceased 87's    ACCIDENTAL  . Sister Alive         His family history includes Breast cancer in his paternal grandmother; Cancer in his  paternal grandfather; Coronary artery disease in his mother; Diabetes in his maternal grandmother, mother, and paternal grandmother; Heart Problems in his maternal grandfather; Hypertension in his maternal grandmother; Stroke in his maternal grandmother and mother.    Allergies  Allergen Reactions  . Erythromycin     Possible cause of GI bleed    Previous Medications   ASPIRIN 325 MG TABLET    Take 1 tablet (325 mg total) by mouth daily.   FEXOFENADINE (ALLEGRA) 180 MG TABLET    Take 180 mg by mouth daily as needed for allergies. Dose unkn   FLUTICASONE (FLONASE) 50 MCG/ACT NASAL SPRAY    Place 2 sprays into both nostrils daily.   IBUPROFEN (ADVIL,MOTRIN) 600 MG TABLET    as needed.   LISINOPRIL-HYDROCHLOROTHIAZIDE (PRINZIDE,ZESTORETIC) 10-12.5 MG PER TABLET    1 tablet daily.   METHOCARBAMOL (ROBAXIN) 500 MG TABLET    Take 1 tablet by mouth 4 (four) times  daily as needed.   MULTIPLE VITAMIN (MULTIVITAMIN WITH MINERALS) TABS TABLET    Take 1 tablet by mouth daily.   VENLAFAXINE XR (EFFEXOR XR) 37.5 MG 24 HR CAPSULE    Take 1 tablet by mouth daily. for 7 days, the 2 tablets daily   ZALEPLON (SONATA) 10 MG CAPSULE    TAKE ONE CAPSULE BY MOUTH AT BEDTIME AS NEEDED FOR SLEEP   ZOLPIDEM (AMBIEN CR) 12.5 MG CR TABLET    Take 1 tablet (12.5 mg total) by mouth at bedtime as needed for sleep.    Patient Care Team: Malva Limesonald E Emonni Depasquale, MD as PCP - General (Family Medicine)     Objective:   Vitals: BP 110/70 mmHg  Pulse 84  Temp(Src) 98 F (36.7 C) (Oral)  Resp 16  Ht 6' 0.5" (1.842 m)  Wt 260 lb (117.935 kg)  BMI 34.76 kg/m2  SpO2 97%   Physical Exam   General Appearance:    Alert, cooperative, no distress, appears stated age, obese  Head:    Normocephalic, without obvious abnormality, atraumatic  Eyes:    PERRL, conjunctiva/corneas clear, EOM's intact, fundi    benign, both eyes       Ears:    Normal TM's and external ear canals, both ears  Nose:   Nares normal, septum midline, mucosa  normal, no drainage   or sinus tenderness  Throat:   Lips, mucosa, and tongue normal; teeth and gums normal  Neck:   Supple, symmetrical, trachea midline, no adenopathy;       thyroid:  No enlargement/tenderness/nodules; no carotid   bruit or JVD  Back:     Symmetric, no curvature, ROM normal, no CVA tenderness  Lungs:     Clear to auscultation bilaterally, respirations unlabored  Chest wall:    No tenderness or deformity  Heart:    Regular rate and rhythm, S1 and S2 normal, no murmur, rub   or gallop  Abdomen:     Soft, non-tender, bowel sounds active all four quadrants,    no masses, no organomegaly  Genitalia:    deferred  Rectal:    deferred  Extremities:   Extremities normal, atraumatic, no cyanosis or edema  Pulses:   2+ and symmetric all extremities  Skin:   Skin color, texture, turgor normal, no rashes or lesions  Lymph nodes:   Cervical, supraclavicular, and axillary nodes normal  Neurologic:   CNII-XII intact. Normal strength, sensation and reflexes      throughout    Depression Screen PHQ 2/9 Scores 02/17/2015  PHQ - 2 Score 0  PHQ- 9 Score 0   Results for orders placed or performed in visit on 02/17/15  POCT glycosylated hemoglobin (Hb A1C)  Result Value Ref Range   Hemoglobin A1C 8.5    Est. average glucose Bld gHb Est-mCnc 197       Assessment & Plan:     Routine Health Maintenance and Physical Exam  Exercise Activities and Dietary recommendations Goals    None      Immunization History  Administered Date(s) Administered  . Tdap 05/29/2006    Health Maintenance  Topic Date Due  . INFLUENZA VACCINE  05/14/2015 (Originally 09/14/2014)  . TETANUS/TDAP  05/28/2016  . HIV Screening  Completed      Discussed health benefits of physical activity, and encouraged him to engage in regular exercise appropriate for his age and condition.    --------------------------------------------------------------------  1. Annual physical exam  - EKG  12-Lead  2. Type 2  diabetes mellitus without complication, without long-term current use of insulin (HCC) Nely diagnosed. Start metformin ER 500mg  daily. He refused referral to lifestyle center but states he know what he needs to do with diet.  - POCT glycosylated hemoglobin (Hb A1C) - EKG 12-Lead - Ambulatory referral to Ophthalmology  3. OSA on CPAP States he is using CPAP every night and it is working well.   4. Obesity (BMI 30-39.9) Counseled on healthy diet changes and regular exercise  5. Insomnia   6. Essential hypertension, benign Well controlled.  Continue current medications.   - EKG 12-Lead  Follow up 3 months.

## 2015-04-10 ENCOUNTER — Other Ambulatory Visit: Payer: Self-pay | Admitting: Family Medicine

## 2015-04-14 ENCOUNTER — Telehealth: Payer: Self-pay | Admitting: Family Medicine

## 2015-04-14 NOTE — Telephone Encounter (Signed)
Recommend he take the metformin in the evening with his dinner instead of in  The morning.

## 2015-04-14 NOTE — Telephone Encounter (Signed)
Pt called thinking the metformin is making him feel bad throughtout the day.  He wants to know if there is something else he can take.'  His call back is [phone]  Thanks Barth Kirks

## 2015-04-14 NOTE — Telephone Encounter (Signed)
Patient stated that about a week after starting metformin, he began having episodes of lightheadedness and nausea, that last about 1-2 hours. Patient stated that he has episodes daily. Patient wanted to know if there is another medication that he can try?

## 2015-04-14 NOTE — Telephone Encounter (Signed)
Patient was notified. Expressed understanding.  

## 2015-04-14 NOTE — Telephone Encounter (Signed)
LMOVM for pt to return call 

## 2015-04-16 ENCOUNTER — Encounter: Payer: Self-pay | Admitting: Family Medicine

## 2015-04-19 ENCOUNTER — Ambulatory Visit (INDEPENDENT_AMBULATORY_CARE_PROVIDER_SITE_OTHER): Payer: PRIVATE HEALTH INSURANCE | Admitting: Family Medicine

## 2015-04-19 ENCOUNTER — Encounter: Payer: Self-pay | Admitting: Family Medicine

## 2015-04-19 ENCOUNTER — Telehealth: Payer: Self-pay | Admitting: Neurology

## 2015-04-19 VITALS — BP 138/96 | HR 76 | Temp 97.8°F | Resp 14 | Wt 248.8 lb

## 2015-04-19 DIAGNOSIS — E1100 Type 2 diabetes mellitus with hyperosmolarity without nonketotic hyperglycemic-hyperosmolar coma (NKHHC): Secondary | ICD-10-CM

## 2015-04-19 DIAGNOSIS — E162 Hypoglycemia, unspecified: Secondary | ICD-10-CM

## 2015-04-19 NOTE — Telephone Encounter (Signed)
Patient called to request a possible appointment today with Dr. Pearlean BrownieSethi, states his A1C was high recently at his PCP visit and was placed on Metformin, since then in the last month he has episodes of dizzy spells, headache in the back of the head, queasy, shakes, states that when he eats something he eventually feels better. Advised Dr. Pearlean BrownieSethi is out of the office today, however we may be able to get him in with NP or work in Librarian, academicDoctor. Patient states he may just call his PCP in case this is related to Metformin and then if PCP feels he needs to follow up with Neurologist, he will call back.

## 2015-04-19 NOTE — Progress Notes (Signed)
Patient ID: Preston DuvalJeffrey W Stukes, male   DOB: 01/20/1971, 45 y.o.   MRN: [id number]   Patient: Preston Nguyen Male    DOB: 09/02/1970   44 y.o.   MRN: [id number] Visit Date: 04/19/2015  Today's Provider: Dortha Kernennis Chrismon, PA   Chief Complaint  Patient presents with  . Headache  . Nausea  . Dizziness   Subjective:    Headache  The current episode started 1 to 4 weeks ago. The problem occurs intermittently. The problem has been gradually worsening. The pain is located in the occipital region. The pain radiates to the right neck. The quality of the pain is described as aching and pulsating. The pain is at a severity of 8/10. Associated symptoms include dizziness and nausea. He has tried NSAIDs for the symptoms. The treatment provided mild relief. (Stroke)  Dizziness The problem has been gradually improving. Associated symptoms include headaches and nausea.  Diagnosed with DM with Hgb A1C 8.5% on 02-17-15 and started on Metformin 500 mg with breakfast qd. Has lost 12 lbs and switched to PM dosing. Blood sugars have been running 80-200 in the mornings. Notice episodes of nausea, headache and shakes on 04-17-15 that quickly responded to Gatorade.  Has a history of cerebellar stroke 06-04-13 five days after having a wisdom tooth removed. Followed by Dr. Pearlean BrownieSethi (neurologist). Patient decided to stop the Lisinopril-HCTZ 10/12.5 mg qd because of the dizziness symptoms.  Past Medical History  Diagnosis Date  . Hypertension   . Seasonal allergic rhinitis   . Insomnia   . Acute stress disorder   . Insomnia   . History of chicken pox    Past Surgical History  Procedure Laterality Date  . Wisdom tooth extraction  05/2013  . Tee without cardioversion N/A 06/06/2013    Procedure: TRANSESOPHAGEAL ECHOCARDIOGRAM (TEE);  Surgeon: Quintella Reichertraci R Turner, MD;  Location: [facility] ENDOSCOPY;  Service: Cardiovascular;  Laterality: N/A;  . Vasectomy  11/28/2013  . Hernia repair  1970's    Inguinal hernia repair. "when he was  younger"   Family History  Problem Relation Age of Onset  . Stroke Mother     aged 3050s  . Coronary artery disease Mother     age 6740s. had CABG  . Diabetes Mother   . Stroke Maternal Grandmother   . Diabetes Maternal Grandmother     type 2  . Hypertension Maternal Grandmother   . Diabetes Paternal Grandmother   . Breast cancer Paternal Grandmother   . Cancer Paternal Grandfather   . Heart Problems Maternal Grandfather    Previous Medications   ASPIRIN 325 MG TABLET    Take 1 tablet (325 mg total) by mouth daily.   ATORVASTATIN (LIPITOR) 40 MG TABLET    Take 1 tablet (40 mg total) by mouth daily. Please place prescription on hold until patient calls for refill   FEXOFENADINE (ALLEGRA) 180 MG TABLET    Take 180 mg by mouth daily as needed for allergies. Dose unkn   FLUTICASONE (FLONASE) 50 MCG/ACT NASAL SPRAY    Place 2 sprays into both nostrils daily.   IBUPROFEN (ADVIL,MOTRIN) 600 MG TABLET    as needed.   LISINOPRIL-HYDROCHLOROTHIAZIDE (PRINZIDE,ZESTORETIC) 10-12.5 MG TABLET    TAKE ONE TABLET BY MOUTH ONCE DAILY   METFORMIN (GLUCOPHAGE-XR) 500 MG 24 HR TABLET    Take 1 tablet (500 mg total) by mouth daily with breakfast.   METHOCARBAMOL (ROBAXIN) 500 MG TABLET    Take 1 tablet by mouth 4 (four) times daily as needed.  MULTIPLE VITAMIN (MULTIVITAMIN WITH MINERALS) TABS TABLET    Take 1 tablet by mouth daily.   VENLAFAXINE XR (EFFEXOR XR) 37.5 MG 24 HR CAPSULE    Take 1 tablet by mouth daily. for 7 days, the 2 tablets daily   ZALEPLON (SONATA) 10 MG CAPSULE    TAKE ONE CAPSULE BY MOUTH AT BEDTIME AS NEEDED FOR SLEEP   ZOLPIDEM (AMBIEN CR) 12.5 MG CR TABLET    Take 1 tablet (12.5 mg total) by mouth at bedtime as needed for sleep.   Allergies  Allergen Reactions  . Erythromycin     Possible cause of GI bleed    Review of Systems  Constitutional: Negative.   Eyes: Negative.   Respiratory: Negative.   Cardiovascular: Negative.   Gastrointestinal: Positive for nausea.    Endocrine: Negative.   Genitourinary: Negative.   Musculoskeletal: Negative.   Skin: Negative.   Neurological: Positive for dizziness and headaches.  Hematological: Negative.   Psychiatric/Behavioral: Negative.     Social History  Substance Use Topics  . Smoking status: Never Smoker   . Smokeless tobacco: Never Used  . Alcohol Use: 0.0 oz/week    0 Standard drinks or equivalent per week     Comment: occasionally   Objective:   BP 138/96 mmHg  Pulse 76  Temp(Src) 97.8 F (36.6 C) (Oral)  Resp 14  Wt 248 lb 12.8 oz (112.855 kg)  Physical Exam  Constitutional: He is oriented to person, place, and time. He appears well-developed and well-nourished. No distress.  HENT:  Head: Normocephalic and atraumatic.  Right Ear: Hearing normal.  Left Ear: Hearing normal.  Nose: Nose normal.  Eyes: Conjunctivae and lids are normal. Right eye exhibits no discharge. Left eye exhibits no discharge. No scleral icterus.  Neck: Neck supple.  Cardiovascular: Normal rate and regular rhythm.   Pulmonary/Chest: Effort normal and breath sounds normal. No respiratory distress.  Abdominal: Soft. Bowel sounds are normal.  Musculoskeletal: Normal range of motion.  Neurological: He is alert and oriented to person, place, and time.  Skin: Skin is intact. No lesion and no rash noted.  Psychiatric: He has a normal mood and affect. His speech is normal and behavior is normal. Thought content normal.      Assessment & Plan:     1. Hypoglycemia Recent episodes of headache, nausea, shaking and weakness suspected to be due to hypoglycemia. Has not tested BS during these episodes. Will get BMP. May need change in medication. Encouraged to eat balanced diet and use protein snacks for hypoglycemia. Declines referral to dietitian/diabetic education center. - Basic Metabolic Panel (BMET)  2. Type 2 diabetes mellitus with hyperosmolarity without coma, without long-term current use of insulin (HCC) Recent  hypoglycemic episodes. Recommend balanced diet with protein snacks to prevent hypoglycemic events. May need different diabetes medication if no improvement in hypoglycemia with diet changes. Recheck BMP.

## 2015-04-19 NOTE — Patient Instructions (Signed)

## 2015-04-20 ENCOUNTER — Telehealth: Payer: Self-pay | Admitting: Family Medicine

## 2015-04-20 LAB — BASIC METABOLIC PANEL
BUN/Creatinine Ratio: 15 (ref 9–20)
BUN: 16 mg/dL (ref 6–24)
CHLORIDE: 98 mmol/L (ref 96–106)
CO2: 23 mmol/L (ref 18–29)
Calcium: 9.8 mg/dL (ref 8.7–10.2)
Creatinine, Ser: 1.08 mg/dL (ref 0.76–1.27)
GFR calc Af Amer: 95 mL/min/{1.73_m2} (ref >59)
GFR calc non Af Amer: 82 mL/min/{1.73_m2} (ref >59)
Glucose: 112 mg/dL — ABNORMAL HIGH (ref 65–99)
POTASSIUM: 4.5 mmol/L (ref 3.5–5.2)
Sodium: 138 mmol/L (ref 134–144)

## 2015-04-20 NOTE — Telephone Encounter (Signed)
Pt is requesting his lab results.  [id number]#[phone]/[id number]#[phone]/MW

## 2015-04-20 NOTE — Telephone Encounter (Signed)
Please review-aa 

## 2015-04-21 NOTE — Telephone Encounter (Signed)
Patient has already been advised of lab results.

## 2015-07-04 ENCOUNTER — Other Ambulatory Visit: Payer: Self-pay | Admitting: Family Medicine

## 2015-07-04 NOTE — Telephone Encounter (Signed)
Please call in zolpidem  

## 2015-07-22 ENCOUNTER — Other Ambulatory Visit: Payer: Self-pay | Admitting: Family Medicine

## 2015-07-22 MED ORDER — LISINOPRIL-HYDROCHLOROTHIAZIDE 10-12.5 MG PO TABS: 1.0000 | ORAL_TABLET | Freq: Every day | ORAL | Status: DC

## 2015-08-06 ENCOUNTER — Encounter: Payer: Self-pay | Admitting: Family Medicine

## 2015-08-07 ENCOUNTER — Other Ambulatory Visit: Payer: Self-pay | Admitting: Family Medicine

## 2015-08-09 IMAGING — CT CT ANGIO HEAD
2 of 8 series · 8 of 33 positions shown · IV contrast (Omni 300)
Comparison: Prior MRI from 06/04/2013.

CLINICAL DATA: Gait imbalance, nausea, vomiting. Prior MRI shows
acute left inferior cerebellar infarcts.

EXAM:
CT ANGIOGRAPHY HEAD AND NECK
TECHNIQUE: Multidetector CT imaging of the head and neck was performed using
the standard protocol during bolus administration of intravenous
contrast. Multiplanar CT image reconstructions and MIPs were
obtained to evaluate the vascular anatomy. Carotid stenosis
measurements (when applicable) are obtained utilizing NASCET
criteria, using the distal internal carotid diameter as the
denominator.
CONTRAST:  50mL OMNIPAQUE IOHEXOL 350 MG/ML SOLN

[Series 7: headangio 2.0 h20f · axial · 0.46mm/px · z∈[-272,-152]mm · 2 of 181 slices shown]
[im 61/181  soft-tissue]
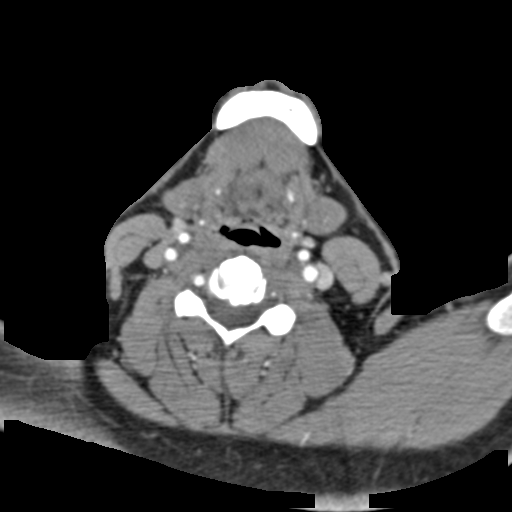
[im 121/181  soft-tissue]
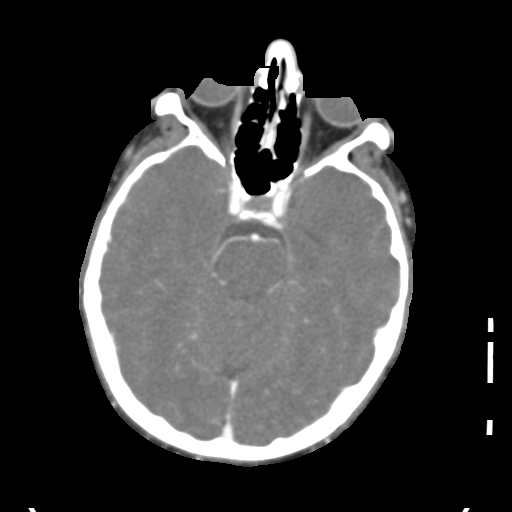

[Series 8: axial 1x1 · axial · 0.39mm/px · z∈[-342,-97]mm · 6 of 344 slices shown]
[im 50/344  soft-tissue]
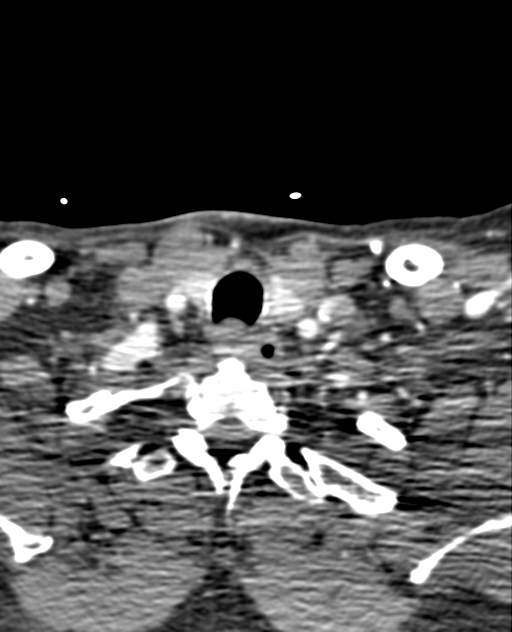
[im 99/344  bone]
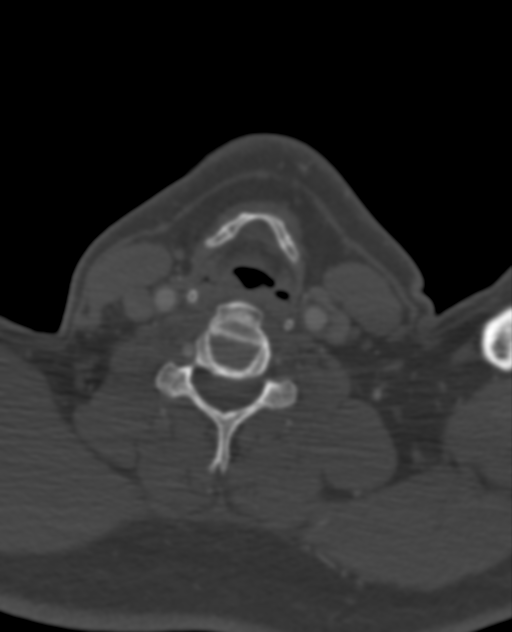
[im 148/344  soft-tissue]
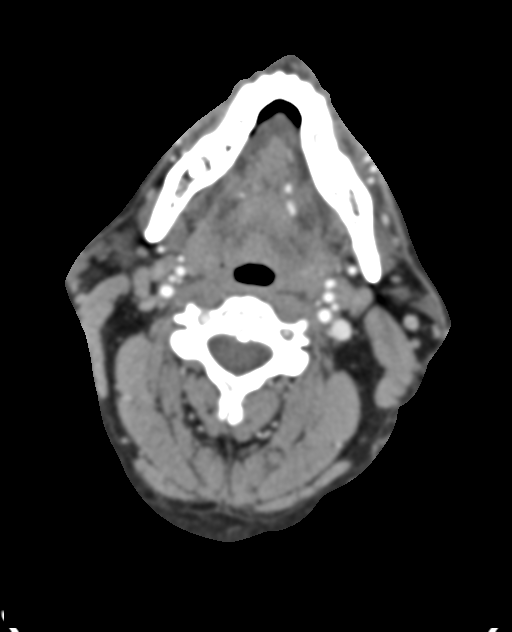
[im 197/344  bone]
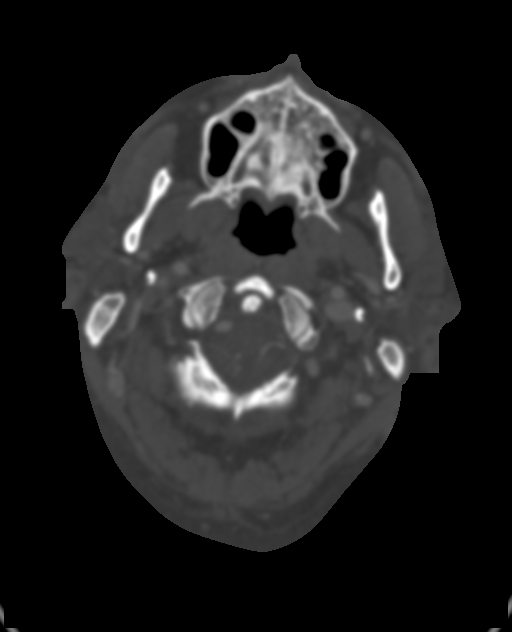
[im 246/344  soft-tissue]
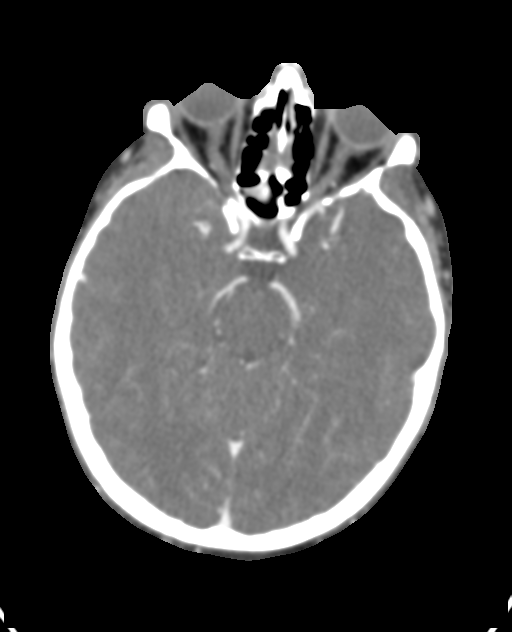
[im 295/344  bone]
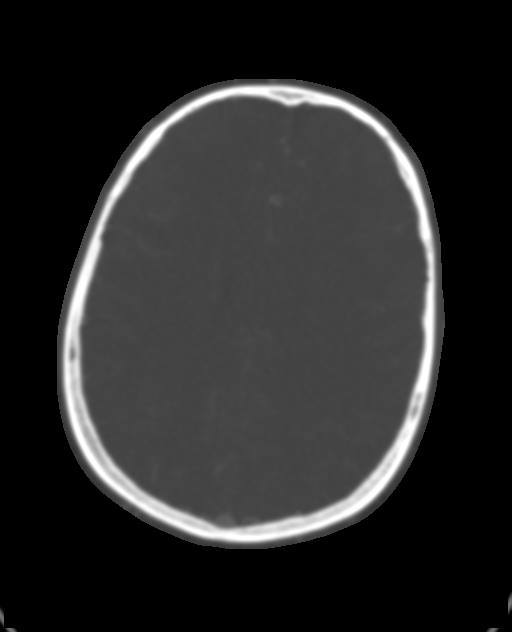

[8 of 33 positions shown; findings below may reference images not displayed]

FINDINGS: Focal hypodensities within the inferior aspect of the left
cerebellar hemisphere again seen, compatible with previous
identified acute ischemic infarcts within this region. No evidence
of hemorrhagic conversion. No new large vessel territory infarct
identified. No mass lesion, midline shift, or extra-axial fluid
collection. No hydrocephalus.

The calvarium is intact. Orbits are normal. Paranasal sinuses and
mastoid air cells are clear.

CTA HEAD FINDINGS

ANTERIOR CIRCULATION:

The petrous, cavernous, and supra clinoid segments of the internal
carotid arteries are well opacified bilaterally without evidence of
high-grade flow-limiting stenosis or other abnormality. Ophthalmic
arteries are grossly normal. The A1 segments are well opacified and
symmetric bilaterally. Anterior communicating artery is normal.
Anterior cerebral arteries are well well opacified bilaterally.

The middle cerebral arteries including the M1 segments and their
distal branches are well opacified bilaterally. No high-grade
proximal conclusion.

No aneurysm seen within the anterior circulation.

POSTERIOR CIRCULATION:

The left V4 segment is markedly diminutive as compared to the right
but grossly patent. No definite high-grade flow-limiting stenosis.
The posterior inferior cerebral arteries are opacified bilaterally.
The vertebrobasilar junction is normal. The basilar artery is widely
patent without evidence of high-grade stenosis or occlusion. The
superior cerebellar arteries and anterior inferior cerebral arteries
are well opacified bilaterally.

No aneurysm identified within the posterior circulation.

Review of the MIP images confirms the above findings.

CTA NECK FINDINGS

Visualized aortic arch is of normal caliber with normal 3 vessel
morphology. No high-grade flow-limiting stenosis seen at the origin
of the great vessels. Should Raquel arteries are widely patent
bilaterally.

The common carotid arteries are well opacified bilaterally and of
symmetric caliber. No evidence of high-grade flow-limiting stenosis
or dissection. The carotid bifurcations are normal bilaterally.
Minimal atherosclerotic plaque noted about the proximal left
internal carotid artery. The internal carotid arteries are well
opacified bilaterally without evidence of high-grade flow-limiting
stenosis, occlusion, or dissection.

The external carotid arteries and their branches are within normal
limits.

The vertebral arteries arise from the subclavian arteries
bilaterally. The right vertebral artery is dominant. The right
vertebral artery is well opacified along its entire course without
evidence of high-grade flow-limiting stenosis, occlusion, or
dissection. The left vertebral artery is markedly diminutive as
compared to the right but is grossly patent to the level of the
vertebrobasilar junction.

Review of the MIP images confirms the above findings.

Visualized soft tissues of the neck are within normal limits without
evidence of mass lesion, adenopathy, or other acute abnormality.
Multilevel degenerative changes and within the visualized spine.
Visualized lung apices are clear.
IMPRESSION: CTA HEAD:

1. No significant interval change in size and distribution of
ischemic infarcts involving the inferior left cerebellar hemisphere.
No evidence of hemorrhagic conversion.
2. Diminutive but patent left vertebral artery to the level of the
vertebrobasilar junction. The posterior inferior cerebral arteries
are opacified bilaterally without evidence of occlusion or definite
high-grade flow-limiting stenosis.
3. Dominant right vertebral artery.
4. No proximal branch occlusion or high-grade flow-limiting stenosis
identified within the intracranial circulation.
5. No intracranial aneurysm.
CTA NECK:

1. Normal CTA of the neck without evidence of high-grade
flow-limiting stenosis, dissection, or occlusion. The left vertebral
artery is diminutive along its entire course but grossly patent.

## 2015-11-23 LAB — CBC AND DIFFERENTIAL
HEMATOCRIT: 48 % (ref 41–53)
HEMOGLOBIN: 15.7 g/dL (ref 13.5–17.5)
Neutrophils Absolute: 5 /uL
Platelets: 303 10*3/uL (ref 150–399)
WBC: 7.8 10*3/mL

## 2015-11-23 LAB — BASIC METABOLIC PANEL
BUN: 21 mg/dL (ref 4–21)
CREATININE: 1.1 mg/dL (ref 0.6–1.3)
Glucose: 154 mg/dL
POTASSIUM: 4.4 mmol/L (ref 3.4–5.3)
SODIUM: 137 mmol/L (ref 137–147)

## 2015-11-23 LAB — LIPID PANEL
Cholesterol: 117 mg/dL (ref 0–200)
HDL: 42 mg/dL (ref 35–70)
LDL CALC: 56 mg/dL
LDL/HDL RATIO: 2.8
Triglycerides: 96 mg/dL (ref 40–160)

## 2015-11-23 LAB — HEPATIC FUNCTION PANEL
ALT: 31 U/L (ref 10–40)
AST: 22 U/L (ref 14–40)
Alkaline Phosphatase: 65 U/L (ref 25–125)
Bilirubin, Total: 0.8 mg/dL

## 2015-11-23 LAB — HEMOGLOBIN A1C: Hemoglobin A1C: 7.2

## 2015-11-23 LAB — TSH: TSH: 3.91 u[IU]/mL (ref 0.41–5.90)

## 2015-12-17 ENCOUNTER — Ambulatory Visit (INDEPENDENT_AMBULATORY_CARE_PROVIDER_SITE_OTHER): Payer: PRIVATE HEALTH INSURANCE | Admitting: Family Medicine

## 2015-12-17 ENCOUNTER — Encounter: Payer: Self-pay | Admitting: Family Medicine

## 2015-12-17 VITALS — BP 120/76 | HR 88 | Temp 98.0°F | Resp 16 | Wt 252.0 lb

## 2015-12-17 DIAGNOSIS — Z8673 Personal history of transient ischemic attack (TIA), and cerebral infarction without residual deficits: Secondary | ICD-10-CM | POA: Diagnosis not present

## 2015-12-17 DIAGNOSIS — E119 Type 2 diabetes mellitus without complications: Secondary | ICD-10-CM

## 2015-12-17 DIAGNOSIS — I10 Essential (primary) hypertension: Secondary | ICD-10-CM | POA: Diagnosis not present

## 2015-12-17 DIAGNOSIS — R42 Dizziness and giddiness: Secondary | ICD-10-CM | POA: Diagnosis not present

## 2015-12-17 MED ORDER — METFORMIN HCL ER 500 MG PO TB24: 250.0000 mg | ORAL_TABLET | Freq: Every day | ORAL | 2 refills | Status: DC

## 2015-12-17 MED ORDER — MECLIZINE HCL 25 MG PO TABS: 25.0000 mg | ORAL_TABLET | Freq: Three times a day (TID) | ORAL | 0 refills | Status: DC | PRN

## 2015-12-17 MED ORDER — LISINOPRIL-HYDROCHLOROTHIAZIDE 10-12.5 MG PO TABS: 1.0000 | ORAL_TABLET | Freq: Every day | ORAL | 2 refills | Status: DC

## 2015-12-17 MED ORDER — ATORVASTATIN CALCIUM 40 MG PO TABS: 40.0000 mg | ORAL_TABLET | Freq: Every day | ORAL | 2 refills | Status: DC

## 2015-12-17 NOTE — Progress Notes (Signed)
Patient: Preston DuvalJeffrey W Obryant Male    DOB: 02/25/1970   45 y.o.   MRN: [id number] Visit Date: 12/17/2015  Today's Provider: Mila Merryonald Fisher, MD   Chief Complaint  Patient presents with  . Dizziness    x 1 year   Subjective:    Dizziness  This is a recurrent problem. The current episode started more than 1 year ago. The problem occurs intermittently. The problem has been unchanged. Associated symptoms include headaches, nausea, vertigo and a visual change (slight blurred vision). Pertinent negatives include no abdominal pain, chest pain, chills, congestion, coughing, diaphoresis, fatigue, fever, numbness, vomiting or weakness. Treatments tried: BC Headache powder or Ibufrofen. The treatment provided mild relief.  Patient states for the past year he has been experiencing dizzy spells intermittently. The dizziness may last between 30 minutes to 4 hours and resolves on its own. He describes this as feeling off balance with slight spinning sensations. No associated visual changes.  Has history of cerebellar stroke in 2015 which presented with similar, but much more severe symptoms. Has since been on atorvastatin and 325mg  ASA in addition to his BP medications.    Follow up diabetes  Lab Results  Component Value Date   HGBA1C 8.5 02/17/2015   Was started on metformin in January 2017 which he initially well with, but stopped in March after having dizzy spells and thought may be due to hypoglycemia. Has since been much more strict with diet and exercise. Wt Readings from Last 3 Encounters:  12/17/15 252 lb (114.3 kg)  04/19/15 248 lb 12.8 oz (112.9 kg)  02/17/15 260 lb (117.9 kg)   Last A1c in October was 7.4 and started back on 1/2 metformin per day.     Allergies  Allergen Reactions  . Erythromycin     Possible cause of GI bleed     Current Outpatient Prescriptions:  .  aspirin 325 MG tablet, Take 1 tablet (325 mg total) by mouth daily., Disp: 90 tablet, Rfl: 1 .   atorvastatin (LIPITOR) 40 MG tablet, Take 1 tablet (40 mg total) by mouth daily. Please place prescription on hold until patient calls for refill, Disp: 90 tablet, Rfl: 3 .  fexofenadine (ALLEGRA) 180 MG tablet, Take 180 mg by mouth daily as needed for allergies. Dose unkn, Disp: , Rfl:  .  fluticasone (FLONASE) 50 MCG/ACT nasal spray, Place 2 sprays into both nostrils daily., Disp: , Rfl:  .  ibuprofen (ADVIL,MOTRIN) 600 MG tablet, as needed., Disp: , Rfl:  .  lisinopril-hydrochlorothiazide (PRINZIDE,ZESTORETIC) 10-12.5 MG tablet, Take 1 tablet by mouth daily., Disp: 30 tablet, Rfl: 6 .  metFORMIN (GLUCOPHAGE-XR) 500 MG 24 hr tablet, Take 1 tablet (500 mg total) by mouth daily with breakfast., Disp: 30 tablet, Rfl: 3 .  methocarbamol (ROBAXIN) 500 MG tablet, Take 1 tablet by mouth 4 (four) times daily as needed., Disp: , Rfl:  .  Multiple Vitamin (MULTIVITAMIN WITH MINERALS) TABS tablet, Take 1 tablet by mouth daily., Disp: , Rfl:  .  venlafaxine XR (EFFEXOR XR) 37.5 MG 24 hr capsule, Take 1 tablet by mouth daily. for 7 days, the 2 tablets daily, Disp: , Rfl:  .  zaleplon (SONATA) 10 MG capsule, TAKE ONE CAPSULE BY MOUTH AT BEDTIME AS NEEDED FOR SLEEP, Disp: 30 capsule, Rfl: 3 .  zolpidem (AMBIEN CR) 12.5 MG CR tablet, TAKE ONE TABLET BY MOUTH AT BEDTIME, Disp: 30 tablet, Rfl: 0  Review of Systems  Constitutional: Negative for appetite change, chills, diaphoresis,  fatigue and fever.  HENT: Negative for congestion.   Respiratory: Negative for cough, chest tightness, shortness of breath and wheezing.   Cardiovascular: Negative for chest pain and palpitations.  Gastrointestinal: Positive for nausea. Negative for abdominal pain and vomiting.  Neurological: Positive for dizziness, vertigo, light-headedness and headaches. Negative for weakness and numbness.    Social History  Substance Use Topics  . Smoking status: Never Smoker  . Smokeless tobacco: Never Used  . Alcohol use 0.0 oz/week      Comment: occasionally   Objective:   BP 120/76 (BP Location: Right Arm, Patient Position: Sitting, Cuff Size: Large)   Pulse 88   Temp 98 F (36.7 C) (Oral)   Resp 16   Wt 252 lb (114.3 kg)   SpO2 97% Comment: room air  BMI 33.71 kg/m   Physical Exam  General Appearance:    Alert, cooperative, no distress  HENT:   ENT exam normal,  Eyes:    PERRL, conjunctiva/corneas clear, EOM's intact       Lungs:     Clear to auscultation bilaterally, respirations unlabored  Heart:    Regular rate and rhythm  Neurologic:   Awake, alert, oriented x 3. No apparent focal neurological           defect. Normal gait.          Assessment & Plan:     1. Dizziness Not clearly vertigo. May be posterior TIAs. Considering history of cerebellar CVA, I recommend MRA for further evaluation. Patient concerned about cost and wants to try medication for vertigo first.   - meclizine (ANTIVERT) 25 MG tablet; Take 1 tablet (25 mg total) by mouth 3 (three) times daily as needed for dizziness.  Dispense: 30 tablet; Refill: 0  Advised to call next time he has symptoms if they do not completely resolve with meclizine.   2. Essential hypertension, benign Well controlled.  Continue current medications.   - lisinopril-hydrochlorothiazide (PRINZIDE,ZESTORETIC) 10-12.5 MG tablet; Take 1 tablet by mouth daily.  Dispense: 90 tablet; Refill: 2  3. Type 2 diabetes mellitus without complication, without long-term current use of insulin (HCC) Doing well with diet and low dose meformin - metFORMIN (GLUCOPHAGE-XR) 500 MG 24 hr tablet; Take 0.5 tablets (250 mg total) by mouth daily with breakfast.  Dispense: 90 tablet; Refill: 2  4. History of cerebellar stroke.  Doing well on atorvastatin.  - atorvastatin (LIPITOR) 40 MG tablet; Take 1 tablet (40 mg total) by mouth daily.  Dispense: 90 tablet; Refill: 2        Mila Merryonald Fisher, MD  [facility]

## 2015-12-20 ENCOUNTER — Encounter: Payer: Self-pay | Admitting: Family Medicine

## 2016-01-12 ENCOUNTER — Other Ambulatory Visit: Payer: Self-pay | Admitting: Family Medicine

## 2016-01-12 MED ORDER — ZOLPIDEM TARTRATE ER 12.5 MG PO TBCR: 12.5000 mg | EXTENDED_RELEASE_TABLET | Freq: Every day | ORAL | 1 refills | Status: DC

## 2016-01-12 NOTE — Telephone Encounter (Signed)
.  Pt contacted office for refill request on the following medications:  zolpidem (AMBIEN CR) 12.5 MG CR tablet.  [facility] [address]  VH#[phone]/UXCB#[phone]/MW

## 2016-01-12 NOTE — Telephone Encounter (Signed)
Medication was last filled 07/04/15 please review and advise. KW

## 2016-01-12 NOTE — Telephone Encounter (Signed)
Please call in zolpidem  

## 2016-01-13 NOTE — Telephone Encounter (Signed)
Rx called in to pharmacy. 

## 2016-01-14 ENCOUNTER — Telehealth: Payer: Self-pay | Admitting: Family Medicine

## 2016-01-14 MED ORDER — ZOLPIDEM TARTRATE 10 MG PO TABS: 10.0000 mg | ORAL_TABLET | Freq: Every evening | ORAL | 5 refills | Status: DC | PRN

## 2016-01-14 NOTE — Telephone Encounter (Signed)
rx called in-aa 

## 2016-01-14 NOTE — Telephone Encounter (Signed)
Pt stated that an Rx for zolpidem (AMBIEN CR) 12.5 MG CR tablet was sent to [facility] [address] on 01/12/16 and since he changed insurance his new insurance won't cover the Controled Release but will cover the Immediate Release. Pt would like the Rx change and a new Rx sent in today if possible for Immediate Release to [facility] [address]. Pt is also requesting the Rx have enough refills to last 6 months like his other medications. Please advise. Thanks TNP

## 2016-01-14 NOTE — Telephone Encounter (Signed)
Please call in zolpidem  

## 2016-01-14 NOTE — Telephone Encounter (Signed)
Please review-aa 

## 2016-02-08 ENCOUNTER — Other Ambulatory Visit: Payer: Self-pay | Admitting: Family Medicine

## 2016-04-12 ENCOUNTER — Encounter: Payer: Self-pay | Admitting: Family Medicine

## 2016-04-18 ENCOUNTER — Telehealth: Payer: Self-pay

## 2016-04-18 DIAGNOSIS — R519 Headache, unspecified: Secondary | ICD-10-CM

## 2016-04-18 DIAGNOSIS — R51 Headache: Principal | ICD-10-CM

## 2016-04-18 DIAGNOSIS — R42 Dizziness and giddiness: Secondary | ICD-10-CM

## 2016-04-18 NOTE — Telephone Encounter (Signed)
Pt was seen in November for dizziness. Was recommended at LOV to consider MRI if sx do not improve. Pt reports he is still experiencing dizziness and H/A, and with his H/O CVA he would like to proceed with MRI. Please review. Allene DillonEmily Drozdowski, CMA

## 2016-04-18 NOTE — Telephone Encounter (Signed)
Please review. Thank you. sd  

## 2016-04-20 ENCOUNTER — Telehealth: Payer: Self-pay | Admitting: Family Medicine

## 2016-04-20 NOTE — Telephone Encounter (Signed)
I called pt concerning MRI appointment.He states that he is feeling better and would like to hold off on scheduling at this time.Pt thinks it may be Metformin that is causing his dizziness/headaches and wants to know if there is something different you could prescribe.He has not taken it for the last several days.This can be sent to [facility] [address]

## 2016-04-20 NOTE — Telephone Encounter (Signed)
Order for MRI entered 

## 2016-04-21 ENCOUNTER — Other Ambulatory Visit: Payer: Self-pay | Admitting: Emergency Medicine

## 2016-04-21 MED ORDER — PIOGLITAZONE HCL 30 MG PO TABS
30.0000 mg | ORAL_TABLET | Freq: Every day | ORAL | 3 refills | Status: DC
Start: 2016-04-21 — End: 2016-09-06

## 2016-04-21 NOTE — Telephone Encounter (Signed)
Pt informed. Medication called in.

## 2016-04-21 NOTE — Telephone Encounter (Signed)
Its not a common side of metformin, but if he wants, he can change to pioglitzone 30mg  onc a day, #30,rf x 3.

## 2016-08-03 ENCOUNTER — Other Ambulatory Visit: Payer: Self-pay | Admitting: Family Medicine

## 2016-08-03 NOTE — Telephone Encounter (Signed)
Patient is overdue for diabetes follow and needs to schedule within the next month. Can call in 30 days of Ambien to get by until office visit.

## 2016-08-04 NOTE — Telephone Encounter (Signed)
Patient advised as below. Patient was in the grocery store when I called and he states he would call back to schedule and appointment. I advised him that once he scheduled a follow up appointment would then call in a 30 day supply to get by until his visit. Patient verbally voiced understanding.

## 2016-08-14 NOTE — Telephone Encounter (Signed)
Please call in zolpidem  

## 2016-08-14 NOTE — Telephone Encounter (Signed)
Called patient to follow up. Patient states he will hold off on the appointment he has a few Ambien tablets left, I advised patient that he is due for routine check up for diabetes. [place] and will call back later at some point-aa

## 2016-08-15 NOTE — Telephone Encounter (Signed)
Rx called in to pharmacy. 

## 2016-09-06 ENCOUNTER — Other Ambulatory Visit: Payer: Self-pay | Admitting: Family Medicine

## 2016-09-14 ENCOUNTER — Encounter: Payer: Self-pay | Admitting: Family Medicine

## 2016-09-19 LAB — BASIC METABOLIC PANEL
CREATININE: 1.1 (ref 0.6–1.3)
GLUCOSE: 112
POTASSIUM: 4.3 (ref 3.4–5.3)
Sodium: 139 (ref 137–147)

## 2016-09-19 LAB — LIPID PANEL
CHOLESTEROL: 109 (ref 0–200)
HDL: 41 (ref 35–70)
LDL Cholesterol: 52
Triglycerides: 81 (ref 40–160)

## 2016-09-19 LAB — HEMOGLOBIN A1C: HEMOGLOBIN A1C: 6

## 2016-09-19 LAB — CBC AND DIFFERENTIAL
HEMATOCRIT: 44 (ref 41–53)
HEMOGLOBIN: 15.7 (ref 13.5–17.5)
Platelets: 282 (ref 150–399)

## 2016-09-19 LAB — HEPATIC FUNCTION PANEL
ALT: 25 (ref 10–40)
AST: 21 (ref 14–40)

## 2016-09-19 LAB — TSH: TSH: 2.51 (ref 0.41–5.90)

## 2016-09-22 ENCOUNTER — Encounter: Payer: Self-pay | Admitting: Family Medicine

## 2016-11-16 ENCOUNTER — Ambulatory Visit (INDEPENDENT_AMBULATORY_CARE_PROVIDER_SITE_OTHER): Payer: PRIVATE HEALTH INSURANCE | Admitting: Family Medicine

## 2016-11-16 ENCOUNTER — Encounter: Payer: Self-pay | Admitting: Family Medicine

## 2016-11-16 VITALS — BP 140/80 | HR 86 | Temp 97.8°F | Resp 16 | Wt 254.0 lb

## 2016-11-16 DIAGNOSIS — I10 Essential (primary) hypertension: Secondary | ICD-10-CM | POA: Diagnosis not present

## 2016-11-16 DIAGNOSIS — E669 Obesity, unspecified: Secondary | ICD-10-CM | POA: Diagnosis not present

## 2016-11-16 DIAGNOSIS — M542 Cervicalgia: Secondary | ICD-10-CM | POA: Diagnosis not present

## 2016-11-16 DIAGNOSIS — G4701 Insomnia due to medical condition: Secondary | ICD-10-CM

## 2016-11-16 DIAGNOSIS — Z8673 Personal history of transient ischemic attack (TIA), and cerebral infarction without residual deficits: Secondary | ICD-10-CM

## 2016-11-16 DIAGNOSIS — G8929 Other chronic pain: Secondary | ICD-10-CM | POA: Diagnosis not present

## 2016-11-16 DIAGNOSIS — E119 Type 2 diabetes mellitus without complications: Secondary | ICD-10-CM | POA: Diagnosis not present

## 2016-11-16 MED ORDER — ZOLPIDEM TARTRATE 10 MG PO TABS: 10.0000 mg | ORAL_TABLET | Freq: Every evening | ORAL | 4 refills | Status: DC | PRN

## 2016-11-16 MED ORDER — LISINOPRIL-HYDROCHLOROTHIAZIDE 10-12.5 MG PO TABS: 1.0000 | ORAL_TABLET | Freq: Every day | ORAL | 3 refills | Status: DC

## 2016-11-16 MED ORDER — ATORVASTATIN CALCIUM 40 MG PO TABS: 40.0000 mg | ORAL_TABLET | Freq: Every day | ORAL | 11 refills | Status: DC

## 2016-11-16 MED ORDER — METHOCARBAMOL 500 MG PO TABS: 500.0000 mg | ORAL_TABLET | Freq: Four times a day (QID) | ORAL | 3 refills | Status: DC | PRN

## 2016-11-16 MED ORDER — PIOGLITAZONE HCL 30 MG PO TABS: 30.0000 mg | ORAL_TABLET | Freq: Every day | ORAL | 11 refills | Status: DC

## 2016-11-16 MED ORDER — METHOCARBAMOL 500 MG PO TABS: 500.0000 mg | ORAL_TABLET | Freq: Four times a day (QID) | ORAL | 2 refills | Status: DC | PRN

## 2016-11-16 MED ORDER — METHOCARBAMOL 500 MG PO TABS: 500.0000 mg | ORAL_TABLET | Freq: Four times a day (QID) | ORAL | 1 refills | Status: DC | PRN

## 2016-11-16 NOTE — Progress Notes (Signed)
Patient: Preston BELTERery Male    DOB: 13-Aug-1970   46 y.o.   MRN: [id number] Visit Date: 11/16/2016  Today's Provider: [name], MD   Chief Complaint  Patient presents with  . Diabetes    follow up  . Hypertension    follow up   Subjective:    HPI  Diabetes Mellitus Type II, Follow-up:   Lab Results  Component Value Date   HGBA1C 6.0 09/19/2016   HGBA1C 7.2 11/23/2015   HGBA1C 8.5 02/17/2015    Last seen for diabetes 11 months ago.  Management since then includes changing from Metformin to Pioglitazone. He reports good compliance with treatment. He is not having side effects.  Current symptoms include none and have been stable. Home blood sugar records:blood sugars are rarely checked  Episodes of hypoglycemia? no   Current Insulin Regimen: none Most Recent Eye Exam: 01/2016 Weight trend: fluctuating a bit Prior visit with dietician: no Current diet: well balanced Current exercise: cardiovascular workout on exercise equipment  Pertinent Labs:    Component Value Date/Time   CHOL 109 09/19/2016   TRIG 81 09/19/2016   HDL 41 09/19/2016   LDLCALC 52 09/19/2016   CREATININE 1.1 09/19/2016   CREATININE 1.08 04/19/2015 1242   CREATININE 1.17 06/04/2013 0948    Wt Readings from Last 3 Encounters:  12/17/15 252 lb (114.3 kg)  04/19/15 248 lb 12.8 oz (112.9 kg)  02/17/15 260 lb (117.9 kg)    ------------------------------------------------------------------------  Hypertension, follow-up:  BP Readings from Last 3 Encounters:  12/17/15 120/76  04/19/15 (!) 138/96  02/17/15 110/70    He was last seen for hypertension 11 months ago.  BP at that visit was 120/76. Management since that visit includes no changes. He reports good compliance with treatment. He is not having side effects.  He is exercising. He is adherent to low salt diet.   Outside blood pressures are not being checked. He is experiencing none.  Patient denies chest pain,  chest pressure/discomfort, claudication, dyspnea, exertional chest pressure/discomfort, fatigue, irregular heart beat, lower extremity edema, near-syncope, orthopnea, palpitations, paroxysmal nocturnal dyspnea, syncope and tachypnea.   Cardiovascular risk factors include diabetes mellitus, hypertension and male gender.  Use of agents associated with hypertension: NSAIDS.     Weight trend: fluctuating a bit Wt Readings from Last 3 Encounters:  12/17/15 252 lb (114.3 kg)  04/19/15 248 lb 12.8 oz (112.9 kg)  02/17/15 260 lb (117.9 kg)    Current diet: well balanced  ------------------------------------------------------------------------     Allergies  Allergen Reactions  . Erythromycin     Possible cause of GI bleed     Current Outpatient Prescriptions:  .  aspirin 325 MG tablet, Take 1 tablet (325 mg total) by mouth daily., Disp: 90 tablet, Rfl: 1 .  atorvastatin (LIPITOR) 40 MG tablet, Take 1 tablet (40 mg total) by mouth daily., Disp: 90 tablet, Rfl: 2 .  fexofenadine (ALLEGRA) 180 MG tablet, Take 180 mg by mouth daily as needed for allergies. Dose unkn, Disp: , Rfl:  .  fluticasone (FLONASE) 50 MCG/ACT nasal spray, Place 2 sprays into both nostrils daily., Disp: , Rfl:  .  ibuprofen (ADVIL,MOTRIN) 600 MG tablet, as needed., Disp: , Rfl:  .  lisinopril-hydrochlorothiazide (PRINZIDE,ZESTORETIC) 10-12.5 MG tablet, Take 1 tablet by mouth daily., Disp: 90 tablet, Rfl: 2 .  Multiple Vitamin (MULTIVITAMIN WITH MINERALS) TABS tablet, Take 1 tablet by mouth daily., Disp: , Rfl:  .  pioglitazone (ACTOS) 30 MG tablet,  TAKE 1 TABLET BY MOUTH ONCE DAILY, Disp: 30 tablet, Rfl: 0 .  zolpidem (AMBIEN) 10 MG tablet, TAKE ONE TABLET BY MOUTH AT BEDTIME AS NEEDED, Disp: 30 tablet, Rfl: 1  Review of Systems  Constitutional: Negative for appetite change, chills and fever.  Respiratory: Negative for chest tightness, shortness of breath and wheezing.   Cardiovascular: Negative for chest pain and  palpitations.  Gastrointestinal: Negative for abdominal pain, nausea and vomiting.    Social History  Substance Use Topics  . Smoking status: Never Smoker  . Smokeless tobacco: Never Used  . Alcohol use 0.0 oz/week     Comment: occasionally   Objective:   BP 140/80 (BP Location: Left Arm, Patient Position: Sitting, Cuff Size: Large)   Pulse 86   Temp 97.8 F (36.6 C) (Oral)   Resp 16   Wt 254 lb (115.2 kg)   SpO2 98% Comment: room air  BMI 33.98 kg/m  There were no vitals filed for this visit.   Physical Exam  General Appearance:    Alert, cooperative, no distress, obese  Eyes:    PERRL, conjunctiva/corneas clear, EOM's intact       Lungs:     Clear to auscultation bilaterally, respirations unlabored  Heart:    Regular rate and rhythm  Neurologic:   Awake, alert, oriented x 3. No apparent focal neurological           defect.            Assessment & Plan:     1. Type 2 diabetes mellitus without complication, without long-term current use of insulin (HCC) Doing well on current medications.  - pioglitazone (ACTOS) 30 MG tablet; Take 1 tablet (30 mg total) by mouth daily.  Dispense: 30 tablet; Refill: 11  2. Essential hypertension, benign Well controlled.  Continue current medications.   - lisinopril-hydrochlorothiazide (PRINZIDE,ZESTORETIC) 10-12.5 MG tablet; Take 1 tablet by mouth daily.  Dispense: 90 tablet; Refill: 3  3. History of stroke  - atorvastatin (LIPITOR) 40 MG tablet; Take 1 tablet (40 mg total) by mouth daily.  Dispense: 30 tablet; Refill: 11  4. Obesity (BMI 30-39.9) Diet and exercise  5. Chronic neck pain refill - methocarbamol (ROBAXIN) 500 MG tablet; Take 1 tablet (500 mg total) by mouth 4 (four) times daily as needed.  Dispense: 30 tablet; Refill: 3  6. Insomnia due to medical condition refill - zolpidem (AMBIEN) 10 MG tablet; Take 1 tablet (10 mg total) by mouth at bedtime as needed.  Dispense: 30 tablet; Refill: 4  He anticipates getting  flu shot at work.         [name], MD  [facility]

## 2016-12-16 ENCOUNTER — Other Ambulatory Visit: Payer: Self-pay | Admitting: Neurology

## 2016-12-18 ENCOUNTER — Telehealth: Payer: Self-pay | Admitting: Family Medicine

## 2016-12-18 NOTE — Telephone Encounter (Signed)
Please advise refill? 

## 2016-12-18 NOTE — Telephone Encounter (Signed)
Pt contacted office for refill request on the following medications:  venlafaxine XR (EFFEXOR XR) 37.5 MG 24 hr capsule  Last Rx: 03/27/14  [facility] [address] Please advise. Thanks TNP

## 2016-12-19 MED ORDER — VENLAFAXINE HCL ER 37.5 MG PO CP24: 37.5000 mg | ORAL_CAPSULE | Freq: Every day | ORAL | 1 refills | Status: DC

## 2016-12-19 NOTE — Telephone Encounter (Signed)
Patient reports that he found out that he will be losing his job soon. He would like to start back on Effexor because it worked before in the past. He states, "just to take the edge off". He uses [facility] [address]. Thanks!

## 2016-12-19 NOTE — Telephone Encounter (Signed)
This is not on his medication list, was discontinued over a year ago. Is he wanting to restart medication?  Need more info.

## 2017-01-28 ENCOUNTER — Other Ambulatory Visit: Payer: Self-pay | Admitting: Family Medicine

## 2017-01-28 DIAGNOSIS — G8929 Other chronic pain: Secondary | ICD-10-CM

## 2017-01-28 DIAGNOSIS — M542 Cervicalgia: Principal | ICD-10-CM

## 2017-05-06 ENCOUNTER — Other Ambulatory Visit: Payer: Self-pay | Admitting: Family Medicine

## 2017-05-06 DIAGNOSIS — G4701 Insomnia due to medical condition: Secondary | ICD-10-CM

## 2017-08-30 DIAGNOSIS — J069 Acute upper respiratory infection, unspecified: Secondary | ICD-10-CM | POA: Diagnosis not present

## 2017-08-31 DIAGNOSIS — J209 Acute bronchitis, unspecified: Secondary | ICD-10-CM | POA: Diagnosis not present

## 2017-08-31 DIAGNOSIS — R05 Cough: Secondary | ICD-10-CM | POA: Diagnosis not present

## 2017-09-05 DIAGNOSIS — R0989 Other specified symptoms and signs involving the circulatory and respiratory systems: Secondary | ICD-10-CM | POA: Diagnosis not present

## 2017-09-05 DIAGNOSIS — R7303 Prediabetes: Secondary | ICD-10-CM | POA: Diagnosis not present

## 2017-09-05 DIAGNOSIS — R05 Cough: Secondary | ICD-10-CM | POA: Diagnosis not present

## 2017-09-05 DIAGNOSIS — I10 Essential (primary) hypertension: Secondary | ICD-10-CM | POA: Diagnosis not present

## 2017-10-11 DIAGNOSIS — R05 Cough: Secondary | ICD-10-CM | POA: Diagnosis not present

## 2017-10-26 ENCOUNTER — Encounter: Payer: Self-pay | Admitting: Family Medicine

## 2017-10-26 ENCOUNTER — Ambulatory Visit (INDEPENDENT_AMBULATORY_CARE_PROVIDER_SITE_OTHER): Payer: Commercial Managed Care - PPO | Admitting: Family Medicine

## 2017-10-26 VITALS — BP 133/80 | HR 92 | Temp 98.2°F | Resp 16 | Ht 73.0 in | Wt 268.0 lb

## 2017-10-26 DIAGNOSIS — E669 Obesity, unspecified: Secondary | ICD-10-CM | POA: Diagnosis not present

## 2017-10-26 DIAGNOSIS — Z23 Encounter for immunization: Secondary | ICD-10-CM

## 2017-10-26 DIAGNOSIS — E119 Type 2 diabetes mellitus without complications: Secondary | ICD-10-CM | POA: Diagnosis not present

## 2017-10-26 DIAGNOSIS — R05 Cough: Secondary | ICD-10-CM | POA: Diagnosis not present

## 2017-10-26 DIAGNOSIS — Z0000 Encounter for general adult medical examination without abnormal findings: Secondary | ICD-10-CM

## 2017-10-26 DIAGNOSIS — Z9989 Dependence on other enabling machines and devices: Secondary | ICD-10-CM

## 2017-10-26 DIAGNOSIS — I10 Essential (primary) hypertension: Secondary | ICD-10-CM

## 2017-10-26 DIAGNOSIS — R053 Chronic cough: Secondary | ICD-10-CM | POA: Insufficient documentation

## 2017-10-26 DIAGNOSIS — G4733 Obstructive sleep apnea (adult) (pediatric): Secondary | ICD-10-CM

## 2017-10-26 LAB — POCT UA - MICROALBUMIN: Microalbumin Ur, POC: 20 mg/L

## 2017-10-26 NOTE — Patient Instructions (Signed)
   Please contact your eyecare professional to schedule a routine eye exam  

## 2017-10-26 NOTE — Progress Notes (Signed)
Patient: Preston Nguyen, Male    DOB: 05/17/1970, 47 y.o.   MRN: [id number] Visit Date: 10/26/2017  Today's Provider: [name], MD   Chief Complaint  Patient presents with  . Annual Exam  . Diabetes  . Hypertension   Subjective:    Annual physical exam Preston Nguyen is a 47 y.o. male who presents today for health maintenance and complete physical. He feels well. He reports exercising none. He reports he is sleeping well.  ---------------------------------------------------------------    Diabetes Mellitus Type II, Follow-up:   Lab Results  Component Value Date   HGBA1C 6.0 09/19/2016   HGBA1C 7.2 11/23/2015   HGBA1C 8.5 02/17/2015   Last seen for diabetes 11 months ago.  Management since then includes; no changes. He reports good compliance with treatment. He is not having side effects. none Current symptoms include none and have been unchanged. Home blood sugar records: fasting range: not checking  Episodes of hypoglycemia? no   Current Insulin Regimen: n/a Most Recent Eye Exam: due Weight trend: increasing steadily Prior visit with dietician: no Current diet: well balanced Current exercise: none  He states he changed jobs in January and drives to [place] every day, no longer finds time to exercise. Has gained 14 pounds since last year.  ---------------------------------------------------------------    Hypertension, follow-up:  BP Readings from Last 3 Encounters:  10/26/17 133/80  11/16/16 140/80  12/17/15 120/76    He was last seen for hypertension 11 months ago.  BP at that visit was 140/80. Management since that visit includes; no changes.He reports good compliance with treatment. He is not having side effects. none He is not exercising. He is adherent to low salt diet.   Outside blood pressures are not checking. He is experiencing none.  Patient denies none.   Cardiovascular risk factors include diabetes mellitus.    Use of agents associated with hypertension: none.   Wt Readings from Last 3 Encounters:  10/26/17 268 lb (121.6 kg)  11/16/16 254 lb (115.2 kg)  12/17/15 252 lb (114.3 kg)    ---------------------------------------------------------------   Chronic Neck Pain From 11/16/2016-no changes. Refilled Robaxin. States it continues to work well. He takes fairly often in the evenings.   Insomnia From 11/16/2016-no changes. Refilled zolpidem. He states it continues to work well, but he only takes on weekends because it sometimes causes some dizziness the next day.   He states he developed a cough after being stung a few months ago. Has had multiple rounds of antibiotic prescribed from u.c. And is gradually improving. Is about 85% better now. No heartburn, but is having some allergy sx and started taking fexofenadine, not taking nasal steroid right now.   Review of Systems  Constitutional: Negative for chills, diaphoresis and fever.  HENT: Negative for congestion, ear discharge, ear pain, hearing loss, nosebleeds, sore throat and tinnitus.   Eyes: Negative for photophobia, pain, discharge and redness.  Respiratory: Positive for cough. Negative for shortness of breath, wheezing and stridor.   Cardiovascular: Negative for chest pain, palpitations and leg swelling.  Gastrointestinal: Negative for abdominal pain, blood in stool, constipation, diarrhea, nausea and vomiting.  Endocrine: Negative for polydipsia.  Genitourinary: Negative for dysuria, flank pain, frequency, hematuria and urgency.  Musculoskeletal: Negative for back pain, myalgias and neck pain.  Skin: Negative for rash.  Allergic/Immunologic: Negative for environmental allergies.  Neurological: Negative for dizziness, tremors, seizures, weakness and headaches.  Hematological: Does not bruise/bleed easily.  Psychiatric/Behavioral: Negative for hallucinations and  suicidal ideas. The patient is not nervous/anxious.     Social History       He  reports that he has never smoked. He has never used smokeless tobacco. He reports that he drinks alcohol. He reports that he does not use drugs.       Social History   Socioeconomic History  . Marital status: Married    Spouse name: [name]  . Number of children: 1  . Years of education: Masters  . Highest education level: Not on file  Occupational History  . Occupation: Pharmacist, hospital  Social Needs  . Financial resource strain: Not on file  . Food insecurity:    Worry: Not on file    Inability: Not on file  . Transportation needs:    Medical: Not on file    Non-medical: Not on file  Tobacco Use  . Smoking status: Never Smoker  . Smokeless tobacco: Never Used  Substance and Sexual Activity  . Alcohol use: Yes    Alcohol/week: 0.0 standard drinks    Comment: occasionally  . Drug use: No  . Sexual activity: Not on file  Lifestyle  . Physical activity:    Days per week: Not on file    Minutes per session: Not on file  . Stress: Not on file  Relationships  . Social connections:    Talks on phone: Not on file    Gets together: Not on file    Attends religious service: Not on file    Active member of club or organization: Not on file    Attends meetings of clubs or organizations: Not on file    Relationship status: Not on file  Other Topics Concern  . Not on file  Social History Narrative   Reports significant stress at work.   Patient is married [name]) and lives at home with his wife and his son.   Patient works full-time.   Patient has a Master's degree.   Patient is right-handed.   Patient drinks one cup of coffee daily and 3-4 sodas per day.    Past Medical History:  Diagnosis Date  . Acute stress disorder   . History of chicken pox   . Hypertension   . Insomnia   . Insomnia   . Seasonal allergic rhinitis      Patient Active Problem List   Diagnosis Date Noted  . Chronic neck pain 11/16/2016  . Type 2 diabetes mellitus without complication,  without long-term current use of insulin (HCC) 12/17/2015  . Cryptogenic stroke (HCC) 02/16/2015  . Headache 02/16/2015  . Obesity (BMI 30-39.9) 09/10/2013  . Insomnia due to medical condition 09/10/2013  . OSA on CPAP 09/10/2013  . History of stroke 06/05/2013  . Essential hypertension, benign 06/04/2013  . Acute stress disorder 07/19/2006  . Fam hx-ischem heart disease 05/29/2006    Past Surgical History:  Procedure Laterality Date  . HERNIA REPAIR  1970's   Inguinal hernia repair. "when he was younger"  . TEE WITHOUT CARDIOVERSION N/A 06/06/2013   Procedure: TRANSESOPHAGEAL ECHOCARDIOGRAM (TEE);  Surgeon: Quintella Reichert, MD;  Location: [facility] ENDOSCOPY;  Service: Cardiovascular;  Laterality: N/A;  . VASECTOMY  11/28/2013  . WISDOM TOOTH EXTRACTION  05/2013    Family History        Family Status  Relation Name Status  . Mother  Alive  . MGM  Alive  . PGM  Deceased  . PGF  Deceased       myeloma  . Father  Deceased at age 48's       ACCIDENTAL  . Sister  Alive  . MGF  (Not Specified)        His family history includes Breast cancer in his paternal grandmother; Cancer in his paternal grandfather; Coronary artery disease in his mother; Diabetes in his maternal grandmother, mother, and paternal grandmother; Heart Problems in his maternal grandfather; Hypertension in his maternal grandmother; Stroke in his maternal grandmother and mother.      Allergies  Allergen Reactions  . Erythromycin     Possible cause of GI bleed     Current Outpatient Medications:  .  aspirin 325 MG tablet, Take 1 tablet (325 mg total) by mouth daily., Disp: 90 tablet, Rfl: 1 .  atorvastatin (LIPITOR) 40 MG tablet, Take 1 tablet (40 mg total) by mouth daily., Disp: 30 tablet, Rfl: 11 .  fexofenadine (ALLEGRA) 180 MG tablet, Take 180 mg by mouth daily as needed for allergies. Dose unkn, Disp: , Rfl:  .  fluticasone (FLONASE) 50 MCG/ACT nasal spray, Place 2 sprays into both nostrils daily., Disp: , Rfl:   .  ibuprofen (ADVIL,MOTRIN) 600 MG tablet, as needed., Disp: , Rfl:  .  lisinopril-hydrochlorothiazide (PRINZIDE,ZESTORETIC) 10-12.5 MG tablet, Take 1 tablet by mouth daily., Disp: 90 tablet, Rfl: 3 .  methocarbamol (ROBAXIN) 500 MG tablet, TAKE 1 TABLET BY MOUTH 4 TIMES DAILY AS NEEDED, Disp: 30 tablet, Rfl: 3 .  Multiple Vitamin (MULTIVITAMIN WITH MINERALS) TABS tablet, Take 1 tablet by mouth daily., Disp: , Rfl:  .  pioglitazone (ACTOS) 30 MG tablet, Take 1 tablet (30 mg total) by mouth daily., Disp: 30 tablet, Rfl: 11 .  zolpidem (AMBIEN) 10 MG tablet, TAKE 1 TABLET BY MOUTH AT BEDTIME AS NEEDED, Disp: 30 tablet, Rfl: 1   Patient Care Team: Malva LimesFisher, Donald E, MD as PCP - General (Family Medicine)      Objective:   Vitals: BP 133/80 (BP Location: Right Arm, Patient Position: Sitting, Cuff Size: Large)   Pulse 92   Temp 98.2 F (36.8 C) (Oral)   Resp 16   Ht 6\' 1"  (1.854 m)   Wt 268 lb (121.6 kg)   SpO2 96%   BMI 35.36 kg/m    Vitals:   10/26/17 1419  BP: 133/80  Pulse: 92  Resp: 16  Temp: 98.2 F (36.8 C)  TempSrc: Oral  SpO2: 96%  Weight: 268 lb (121.6 kg)  Height: 6\' 1"  (1.854 m)     Physical Exam   General Appearance:    Alert, cooperative, no distress, appears stated age, obese  Head:    Normocephalic, without obvious abnormality, atraumatic  Eyes:    PERRL, conjunctiva/corneas clear, EOM's intact, fundi    benign, both eyes       Ears:    Normal TM's and external ear canals, both ears  Nose:   Nares normal, septum midline, mucosa normal, no drainage   or sinus tenderness  Throat:   Lips, mucosa, and tongue normal; teeth and gums normal  Neck:   Supple, symmetrical, trachea midline, no adenopathy;       thyroid:  No enlargement/tenderness/nodules; no carotid   bruit or JVD  Back:     Symmetric, no curvature, ROM normal, no CVA tenderness  Lungs:     Clear to auscultation bilaterally, respirations unlabored  Chest wall:    No tenderness or deformity    Heart:    Regular rate and rhythm, S1 and S2 normal, no murmur, rub   or  gallop  Abdomen:     Soft, non-tender, bowel sounds active all four quadrants,    no masses, no organomegaly  Genitalia:    deferred  Rectal:    deferred  Extremities:   Extremities normal, atraumatic, no cyanosis or edema  Pulses:   2+ and symmetric all extremities  Skin:   Skin color, texture, turgor normal, no rashes or lesions  Lymph nodes:   Cervical, supraclavicular, and axillary nodes normal  Neurologic:   CNII-XII intact. Normal strength, sensation and reflexes      throughout    Depression Screen PHQ 2/9 Scores 10/26/2017 11/16/2016 02/17/2015  PHQ - 2 Score 0 0 0  PHQ- 9 Score 0 0 0      Assessment & Plan:     Routine Health Maintenance and Physical Exam  Exercise Activities and Dietary recommendations Goals   None     Immunization History  Administered Date(s) Administered  . Tdap 05/29/2006    Health Maintenance  Topic Date Due  . PNEUMOCOCCAL POLYSACCHARIDE VACCINE AGE 32-64 HIGH RISK  01/14/1972  . FOOT EXAM  01/14/1980  . OPHTHALMOLOGY EXAM  01/14/1980  . TETANUS/TDAP  05/28/2016  . HEMOGLOBIN A1C  03/22/2017  . INFLUENZA VACCINE  09/13/2017  . HIV Screening  Completed     Discussed health benefits of physical activity, and encouraged him to engage in regular exercise appropriate for his age and condition.    --------------------------------------------------------------------   1. Annual physical exam  - Comprehensive metabolic panel - Lipid panel - Hemoglobin A1c  2. Persistent cough Likely post viral, possible exacerbated by allergies. Try adding his nasal steroid to fexofenadine. Expect resolution over the next few weeks.   3. Obesity (BMI 30-39.9) Worse since change jobs and not exercising. Try to find time to exercise regularly again.   4. Type 2 diabetes mellitus without complication, without long-term current use of insulin (HCC)  - Lipid panel -  Hemoglobin A1c - Pneumococcal polysaccharide vaccine 23-valent greater than or equal to 2yo subcutaneous/IM - POCT UA - Microalbumin  5. . Essential hypertension, benign Well controlled.  Continue current medications.   - Comprehensive metabolic panel  6. Need for influenza vaccination  - Flu Vaccine QUAD 36+ mos IM  7. Need for pneumococcal vaccination  - Pneumococcal polysaccharide vaccine 23-valent greater than or equal to 2yo subcutaneous/IM  8. Need for prophylactic vaccination using tetanus and diphtheria toxoids adsorbed (Td) vaccine  - Td vaccine greater than or equal to 7yo preservative free IM    [name], MD  [facility]

## 2017-10-30 ENCOUNTER — Other Ambulatory Visit: Payer: Self-pay | Admitting: Family Medicine

## 2017-10-30 DIAGNOSIS — Z0000 Encounter for general adult medical examination without abnormal findings: Secondary | ICD-10-CM | POA: Diagnosis not present

## 2017-10-30 DIAGNOSIS — I10 Essential (primary) hypertension: Secondary | ICD-10-CM | POA: Diagnosis not present

## 2017-10-30 DIAGNOSIS — E119 Type 2 diabetes mellitus without complications: Secondary | ICD-10-CM | POA: Diagnosis not present

## 2017-10-31 LAB — COMPREHENSIVE METABOLIC PANEL
ALBUMIN: 4.7 g/dL (ref 3.5–5.5)
ALK PHOS: 68 IU/L (ref 39–117)
ALT: 45 IU/L — ABNORMAL HIGH (ref 0–44)
AST: 25 IU/L (ref 0–40)
Albumin/Globulin Ratio: 2.1 (ref 1.2–2.2)
BUN / CREAT RATIO: 24 — AB (ref 9–20)
BUN: 21 mg/dL (ref 6–24)
Bilirubin Total: 0.6 mg/dL (ref 0.0–1.2)
CO2: 24 mmol/L (ref 20–29)
CREATININE: 0.89 mg/dL (ref 0.76–1.27)
Calcium: 9.2 mg/dL (ref 8.7–10.2)
Chloride: 100 mmol/L (ref 96–106)
GFR calc Af Amer: 118 mL/min/{1.73_m2} (ref >59)
GFR, EST NON AFRICAN AMERICAN: 102 mL/min/{1.73_m2} (ref >59)
GLOBULIN, TOTAL: 2.2 g/dL (ref 1.5–4.5)
Glucose: 198 mg/dL — ABNORMAL HIGH (ref 65–99)
Potassium: 4.4 mmol/L (ref 3.5–5.2)
SODIUM: 139 mmol/L (ref 134–144)
Total Protein: 6.9 g/dL (ref 6.0–8.5)

## 2017-10-31 LAB — LIPID PANEL
CHOL/HDL RATIO: 2.7 ratio (ref 0.0–5.0)
Cholesterol, Total: 119 mg/dL (ref 100–199)
HDL: 44 mg/dL (ref >39)
LDL Calculated: 59 mg/dL (ref 0–99)
Triglycerides: 79 mg/dL (ref 0–149)
VLDL CHOLESTEROL CAL: 16 mg/dL (ref 5–40)

## 2017-10-31 LAB — HEMOGLOBIN A1C
ESTIMATED AVERAGE GLUCOSE: 197 mg/dL
HEMOGLOBIN A1C: 8.5 % — AB (ref 4.8–5.6)

## 2017-11-01 ENCOUNTER — Other Ambulatory Visit: Payer: Self-pay | Admitting: Family Medicine

## 2017-11-01 DIAGNOSIS — Z8673 Personal history of transient ischemic attack (TIA), and cerebral infarction without residual deficits: Secondary | ICD-10-CM

## 2017-11-01 DIAGNOSIS — I10 Essential (primary) hypertension: Secondary | ICD-10-CM

## 2017-11-01 DIAGNOSIS — E119 Type 2 diabetes mellitus without complications: Secondary | ICD-10-CM

## 2017-11-01 MED ORDER — LISINOPRIL-HYDROCHLOROTHIAZIDE 10-12.5 MG PO TABS: 1.0000 | ORAL_TABLET | Freq: Every day | ORAL | 4 refills | Status: DC

## 2017-11-01 MED ORDER — ATORVASTATIN CALCIUM 40 MG PO TABS: 40.0000 mg | ORAL_TABLET | Freq: Every day | ORAL | 4 refills | Status: DC

## 2017-11-01 MED ORDER — PIOGLITAZONE HCL 30 MG PO TABS: 30.0000 mg | ORAL_TABLET | Freq: Every day | ORAL | 4 refills | Status: DC

## 2017-11-01 NOTE — Progress Notes (Signed)
LMOVM for pt to return call 

## 2017-11-01 NOTE — Progress Notes (Signed)
Patient was notified of results. Expressed understanding. Rx's sent to pharmacy.  

## 2017-11-01 NOTE — Progress Notes (Signed)
Cholesterol well controlled at 119. A1c is up to 8.5. Need to work on losing weight and exercising 150 minutes per week. Follow up fordiabetes in 3 months. If not doing better will need to add another medication. Ok to send in 90 day refills for medications to pharmacy of his choice.

## 2018-05-30 ENCOUNTER — Telehealth: Payer: Self-pay | Admitting: Family Medicine

## 2018-05-30 NOTE — Telephone Encounter (Signed)
Pt called saying he went to a walk in [place] and he had his A1C checked and it was high.  He wants to see Dr. Sherrie Mustache to get him to go over the lab and see if he needs to have more labs done or get back on Metformin or something else .  CB#  [phone]  Thanks Barth Kirks

## 2018-05-31 NOTE — Telephone Encounter (Signed)
Pt's A1C was 10.6 at the walk in in [place].  I advised pt he would need a virtual visit to discuss treatment options.  He agreed.  Pt had a Doxy[employer]me apt on 06/03/2018 at 3pm.   Thanks,   -[name]

## 2018-06-03 ENCOUNTER — Other Ambulatory Visit: Payer: Self-pay

## 2018-06-03 ENCOUNTER — Ambulatory Visit (INDEPENDENT_AMBULATORY_CARE_PROVIDER_SITE_OTHER): Payer: Commercial Managed Care - PPO | Admitting: Family Medicine

## 2018-06-03 ENCOUNTER — Encounter: Payer: Self-pay | Admitting: Family Medicine

## 2018-06-03 VITALS — Temp 97.0°F

## 2018-06-03 DIAGNOSIS — G8929 Other chronic pain: Secondary | ICD-10-CM

## 2018-06-03 DIAGNOSIS — F430 Acute stress reaction: Secondary | ICD-10-CM | POA: Diagnosis not present

## 2018-06-03 DIAGNOSIS — M542 Cervicalgia: Secondary | ICD-10-CM

## 2018-06-03 DIAGNOSIS — E119 Type 2 diabetes mellitus without complications: Secondary | ICD-10-CM | POA: Diagnosis not present

## 2018-06-03 DIAGNOSIS — G4701 Insomnia due to medical condition: Secondary | ICD-10-CM

## 2018-06-03 MED ORDER — METHOCARBAMOL 500 MG PO TABS: ORAL_TABLET | ORAL | 1 refills | Status: DC

## 2018-06-03 MED ORDER — METFORMIN HCL ER 500 MG PO TB24: 500.0000 mg | ORAL_TABLET | Freq: Every day | ORAL | 3 refills | Status: DC

## 2018-06-03 MED ORDER — VENLAFAXINE HCL ER 37.5 MG PO CP24: 37.5000 mg | ORAL_CAPSULE | Freq: Every day | ORAL | 1 refills | Status: DC

## 2018-06-03 MED ORDER — ZOLPIDEM TARTRATE 10 MG PO TABS: 10.0000 mg | ORAL_TABLET | Freq: Every evening | ORAL | 1 refills | Status: DC | PRN

## 2018-06-03 NOTE — Patient Instructions (Signed)
.   Please review the attached list of medications and notify my office if there are any errors.   . Please bring all of your medications to every appointment so we can make sure that our medication list is the same as yours.   

## 2018-06-03 NOTE — Progress Notes (Signed)
Patient: Preston Nguyen Male    DOB: April 26, 1970   47 y.o.   MRN: [id number] Visit Date: 06/03/2018  Today's Provider: [name], MD   Chief Complaint  Patient presents with  . Diabetes   Virtual Visit via Video Note  I connected with Preston Nguyen on 06/03/18 at  3:00 PM EDT by a video enabled telemedicine application and verified that I am speaking with the correct person using two identifiers.   I discussed the limitations of evaluation and management by telemedicine and the availability of in person appointments. The patient expressed understanding and agreed to proceed.   Subjective:     Diabetes Mellitus Type II, Follow-up:   Lab Results  Component Value Date   HGBA1C 8.5 (H) 10/30/2017   HGBA1C 6.0 09/19/2016   HGBA1C 7.2 11/23/2015    Last seen for diabetes 7 months ago.  Management since then includes advised to exercise more, eat healthier and lose weight.  He states that after visit in September he started himself back on metformin (which he has previously discontinued due to dizziness he attributed to medications) and stopped pioglitazone.  Patient recently had his A1C checked at a walk in clinic in [place], [place] and the result was 10.6 (on 04-26-2018). He also had cough and was diagnosed with bronchitis at that time. He states that after that visit he stopped metformin and started back on pioglitazone.    He also states he continues to have persistent nagging cough since being treated for bronchitis at walk-in clinic last month. However, it Is greatly improved and has no dyspnea or fevers.   Pertinent Labs:    Component Value Date/Time   CHOL 119 10/30/2017 0840   TRIG 79 10/30/2017 0840   HDL 44 10/30/2017 0840   LDLCALC 59 10/30/2017 0840   CREATININE 0.89 10/30/2017 0840   CREATININE 1.17 06/04/2013 0948    Wt Readings from Last 3 Encounters:  10/26/17 268 lb (121.6 kg)  11/16/16 254 lb (115.2 kg)  12/17/15 252 lb (114.3 kg)    ------------------------------------------------------------------------ Follow up anxiety He was previously treated with venlafaxine for anxiety. He has not taken it for a few years, but has had more anxiety the last few months related to pandemic, and requests prescription to start back on medication which he felt worked well and was well tolerated.   Insomnia He reports that zolpidem continues to work well, does not take every night, but has had to take more frequently lately due to increased stress and anxiety  Back strain.  Has history of intermittent neckstrain and reports he take methocarbamol occasionally which works well with no adverse effects.    Allergies  Allergen Reactions  . Erythromycin     Possible cause of GI bleed     Current Outpatient Medications:  .  aspirin 325 MG tablet, Take 1 tablet (325 mg total) by mouth daily., Disp: 90 tablet, Rfl: 1 .  atorvastatin (LIPITOR) 40 MG tablet, Take 1 tablet (40 mg total) by mouth daily., Disp: 90 tablet, Rfl: 4 .  fexofenadine (ALLEGRA) 180 MG tablet, Take 180 mg by mouth daily as needed for allergies. Dose unkn, Disp: , Rfl:  .  fluticasone (FLONASE) 50 MCG/ACT nasal spray, Place 2 sprays into both nostrils daily., Disp: , Rfl:  .  ibuprofen (ADVIL,MOTRIN) 600 MG tablet, as needed., Disp: , Rfl:  .  lisinopril-hydrochlorothiazide (PRINZIDE,ZESTORETIC) 10-12.5 MG tablet, Take 1 tablet by mouth daily., Disp: 90 tablet, Rfl: 4 .  Multiple Vitamin (MULTIVITAMIN WITH MINERALS) TABS tablet, Take 1 tablet by mouth daily., Disp: , Rfl:  .  pioglitazone (ACTOS) 30 MG tablet, Take 1 tablet (30 mg total) by mouth daily., Disp: 90 tablet, Rfl: 4 .  zolpidem (AMBIEN) 10 MG tablet, TAKE 1 TABLET BY MOUTH AT BEDTIME AS NEEDED, Disp: 30 tablet, Rfl: 1 .  methocarbamol (ROBAXIN) 500 MG tablet, TAKE 1 TABLET BY MOUTH 4 TIMES DAILY AS NEEDED (Patient not taking: Reported on 06/03/2018), Disp: 30 tablet, Rfl: 3  Review of Systems   Constitutional: Negative for appetite change, chills and fever.  Respiratory: Positive for cough. Negative for chest tightness, shortness of breath and wheezing.   Cardiovascular: Negative for chest pain and palpitations.  Gastrointestinal: Negative for abdominal pain, nausea and vomiting.    Social History   Tobacco Use  . Smoking status: Never Smoker  . Smokeless tobacco: Never Used  Substance Use Topics  . Alcohol use: Yes    Alcohol/week: 0.0 standard drinks    Comment: occasionally      Objective:   Temp (!) 97 F (36.1 C)  Vitals:   06/03/18 1124  Temp: (!) 97 F (36.1 C)  Patient reported)  Physical Exam  General appearance: alert, well developed, well nourished, cooperative and in no distress Head: Normocephalic, without obvious abnormality, atraumatic Respiratory: Respirations even and unlabored, normal respiratory rate Extremities: No gross deformities Skin: Skin color, texture, turgor normal. No rashes seen  Psych: Appropriate mood and affect. Neurologic: Mental status: Alert, oriented to person, place, and time, thought content appropriate.     Assessment & Plan    1. Type 2 diabetes mellitus without complication, without long-term current use of insulin (HCC) Uncontrolled with recent a1c=10.6 at walk-in clinic on 04-26-2018. Has since changed from metformin to pioglitazone. Counseled that neither medication is associated with significant hypoglycemia and he can start taking both. He has started back on more regular exercise regiment and working on losing weight.  - metFORMIN (GLUCOPHAGE-XR) 500 MG 24 hr tablet; Take 1 tablet (500 mg total) by mouth daily with breakfast.  Dispense: 90 tablet; Refill: 3  He is to follow up in 4-6 weeks to check a1c. He is going to call back to schedule.   2. Insomnia due to medical condition. Well controlled. Continue current medications.   refill- zolpidem (AMBIEN) 10 MG tablet; Take 1 tablet (10 mg total) by mouth at  bedtime as needed.  Dispense: 90 tablet; Refill: 1  3. Chronic neck pain refill- methocarbamol (ROBAXIN) 500 MG tablet; TAKE 1 TABLET BY MOUTH 4 TIMES DAILY AS NEEDED  Dispense: 90 tablet; Refill: 1  4. Acute stress disorder Previously did well with venlafaxine which we will restart today and address at follow up in 4-6 weeks.  - venlafaxine XR (EFFEXOR XR) 37.5 MG 24 hr capsule; Take 1 capsule (37.5 mg total) by mouth daily.  Dispense: 90 capsule; Refill: 1       I discussed the assessment and treatment plan with the patient. The patient was provided an opportunity to ask questions and all were answered. The patient agreed with the plan and demonstrated an understanding of the instructions.   The patient was advised to call back or seek an in-person evaluation if the symptoms worsen or if the condition fails to improve as anticipated.  I provided 20 minutes of non-face-to-face time during this encounter.   Mila Merryonald Fisher, MD  [facility]

## 2018-06-03 NOTE — Telephone Encounter (Signed)
Virtual visit 06/03/2018.  

## 2018-07-09 ENCOUNTER — Other Ambulatory Visit: Payer: Self-pay

## 2018-07-09 DIAGNOSIS — G4701 Insomnia due to medical condition: Secondary | ICD-10-CM

## 2018-07-09 NOTE — Telephone Encounter (Signed)
Patient called saying that he was supposed to call to let us know he is due to have his A1c checked. Ok to order?  He is also requesting refills on Ambien sent into mail order pharmacy. [facility]. Please review. Contact info is correct. Thanks!

## 2018-07-10 ENCOUNTER — Telehealth: Payer: Self-pay

## 2018-07-10 DIAGNOSIS — Z8673 Personal history of transient ischemic attack (TIA), and cerebral infarction without residual deficits: Secondary | ICD-10-CM

## 2018-07-10 DIAGNOSIS — R0683 Snoring: Secondary | ICD-10-CM

## 2018-07-10 DIAGNOSIS — G4730 Sleep apnea, unspecified: Secondary | ICD-10-CM

## 2018-07-10 DIAGNOSIS — E119 Type 2 diabetes mellitus without complications: Secondary | ICD-10-CM

## 2018-07-10 DIAGNOSIS — G4700 Insomnia, unspecified: Secondary | ICD-10-CM

## 2018-07-10 MED ORDER — ZOLPIDEM TARTRATE 10 MG PO TABS: 10.0000 mg | ORAL_TABLET | Freq: Every evening | ORAL | 1 refills | Status: DC | PRN

## 2018-07-10 NOTE — Telephone Encounter (Signed)
He can do drive up J9E

## 2018-07-10 NOTE — Telephone Encounter (Signed)
Patient called and left message yesterday about getting his labs completed.  His insurance runs out at the end of this month.  Would also like to get sleeping pill that he had in the past, not Zolpidem  Call back # [phone]

## 2018-07-11 NOTE — Addendum Note (Signed)
Addended by: Marjie Skiff on: 07/11/2018 03:27 PM   Modules accepted: Orders

## 2018-07-11 NOTE — Telephone Encounter (Signed)
Patient calling that he has not heard about his lab. Advised patient what Dr. Sherrie Mustache had advised to come get one. "He can do drive up [place]" Patient reports that he is in [place] and would like to go and get the labs at the [facility] near him today. I went ahead and placed the order for him to have his A1C lab done.   Patient also reports that the prescription he had requested on Tuesday was for Zaleplon and not for Zolpidem reports that he takes both but the Zaleplon is for the weekend because is not so strong. Reports that he got this medicine from [facility]. He is requesting a 90 day supply to be send to [facility].

## 2018-07-12 LAB — HEMOGLOBIN A1C
Est. average glucose Bld gHb Est-mCnc: 203 mg/dL
Hgb A1c MFr Bld: 8.7 % — ABNORMAL HIGH (ref 4.8–5.6)

## 2018-07-15 ENCOUNTER — Telehealth: Payer: Self-pay

## 2018-07-15 MED ORDER — ZALEPLON 10 MG PO CAPS: 10.0000 mg | ORAL_CAPSULE | Freq: Every evening | ORAL | 3 refills | Status: DC | PRN

## 2018-07-15 NOTE — Telephone Encounter (Signed)
Pt is calling checking on this.  He would like to try Zaleplon for sleep. Please send to [facility] [address]  (Pt's insurance changes as of today).   Thanks,   -[name]

## 2018-07-15 NOTE — Addendum Note (Signed)
Addended by: Malva Limes on: 07/15/2018 12:43 PM   Modules accepted: Orders

## 2018-07-15 NOTE — Telephone Encounter (Signed)
-----   Message from Malva Limes, MD sent at 07/12/2018  3:31 PM EDT ----- a1c is down to 8.7. I think he said it was 10 something last month. Its still high, but much better. He needs to take the pioglitazone and the metformin every day and schedule follow up in 3-4 months.

## 2018-07-15 NOTE — Telephone Encounter (Signed)
Pt advised.  He is going to call back and schedule an appointment later.   Thanks,   -[name]

## 2018-11-25 ENCOUNTER — Telehealth: Payer: Self-pay | Admitting: Family Medicine

## 2018-11-25 DIAGNOSIS — F430 Acute stress reaction: Secondary | ICD-10-CM

## 2018-11-25 DIAGNOSIS — Z8673 Personal history of transient ischemic attack (TIA), and cerebral infarction without residual deficits: Secondary | ICD-10-CM

## 2018-11-25 DIAGNOSIS — I10 Essential (primary) hypertension: Secondary | ICD-10-CM

## 2018-11-25 DIAGNOSIS — E119 Type 2 diabetes mellitus without complications: Secondary | ICD-10-CM

## 2018-11-25 DIAGNOSIS — G4701 Insomnia due to medical condition: Secondary | ICD-10-CM

## 2018-11-25 NOTE — Telephone Encounter (Signed)
Pt needing a refill on:  zolpidem (AMBIEN) 10 MG tablet - out lisinopril-hydrochlorothiazide (PRINZIDE,ZESTORETIC) 10-12.5 MG tablet - out  Needing refills on: atorvastatin (LIPITOR) 40 MG tablet pioglitazone (ACTOS) 30 MG tablet venlafaxine XR (EFFEXOR XR) 37.5 MG 24 hr capsule  Might need both refill and refills on: methocarbamol (ROBAXIN) 500 MG tablet   Please refill at:  [id number] [address], [place] (Phone) [phone] (Fax)   Thanks, [place]

## 2018-11-25 NOTE — Telephone Encounter (Signed)
I called patient because it has been 6 months since patient follow up, he states that he normally follows up once a year and was wanting you to fill prescriptions and he has said that he will follow up in 6 months. KW

## 2018-11-26 MED ORDER — ATORVASTATIN CALCIUM 40 MG PO TABS: 40.0000 mg | ORAL_TABLET | Freq: Every day | ORAL | 2 refills | Status: DC

## 2018-11-26 MED ORDER — LISINOPRIL-HYDROCHLOROTHIAZIDE 10-12.5 MG PO TABS: 1.0000 | ORAL_TABLET | Freq: Every day | ORAL | 2 refills | Status: DC

## 2018-11-26 MED ORDER — ZOLPIDEM TARTRATE 10 MG PO TABS: 10.0000 mg | ORAL_TABLET | Freq: Every evening | ORAL | 0 refills | Status: DC | PRN

## 2018-11-26 MED ORDER — VENLAFAXINE HCL ER 37.5 MG PO CP24: 37.5000 mg | ORAL_CAPSULE | Freq: Every day | ORAL | 1 refills | Status: DC

## 2018-11-26 MED ORDER — PIOGLITAZONE HCL 30 MG PO TABS: 30.0000 mg | ORAL_TABLET | Freq: Every day | ORAL | 1 refills | Status: DC

## 2018-11-26 NOTE — Telephone Encounter (Signed)
Patient advised.KW 

## 2018-11-26 NOTE — Telephone Encounter (Signed)
Diabetes is uncontrolled when last seen in may. Have sent rf for one month, needs o.v. before next refill.

## 2019-01-06 ENCOUNTER — Telehealth: Payer: Self-pay

## 2019-01-06 DIAGNOSIS — I10 Essential (primary) hypertension: Secondary | ICD-10-CM

## 2019-01-06 DIAGNOSIS — Z8673 Personal history of transient ischemic attack (TIA), and cerebral infarction without residual deficits: Secondary | ICD-10-CM

## 2019-01-06 DIAGNOSIS — E119 Type 2 diabetes mellitus without complications: Secondary | ICD-10-CM

## 2019-01-06 NOTE — Telephone Encounter (Signed)
Needs to schedule follow up appointment. Can print order for lipids, hgba1c, and met c to drawn 1-2 days before appointment. Needs to be fasting.

## 2019-01-06 NOTE — Telephone Encounter (Signed)
Copied from [place] [id number]. Topic: Appointment Scheduling - Scheduling Inquiry for Clinic >> Jan 06, 2019  3:28 PM Erick Blinks wrote: Reason for CRM: Pt wants to do blood work, requesting lab orders.  Best contact: [phone]

## 2019-01-07 NOTE — Telephone Encounter (Signed)
Patient advised. Appointment scheduled for 12/1 for a virtual visit per patient's request.

## 2019-01-09 LAB — HEMOGLOBIN A1C
Est. average glucose Bld gHb Est-mCnc: 194 mg/dL
Hgb A1c MFr Bld: 8.4 % — ABNORMAL HIGH (ref 4.8–5.6)

## 2019-01-09 LAB — COMPREHENSIVE METABOLIC PANEL
ALT: 50 IU/L — ABNORMAL HIGH (ref 0–44)
AST: 27 IU/L (ref 0–40)
Albumin/Globulin Ratio: 2 (ref 1.2–2.2)
Albumin: 4.7 g/dL (ref 4.0–5.0)
Alkaline Phosphatase: 68 IU/L (ref 39–117)
BUN/Creatinine Ratio: 29 — ABNORMAL HIGH (ref 9–20)
BUN: 26 mg/dL — ABNORMAL HIGH (ref 6–24)
Bilirubin Total: 0.6 mg/dL (ref 0.0–1.2)
CO2: 20 mmol/L (ref 20–29)
Calcium: 9.4 mg/dL (ref 8.7–10.2)
Chloride: 97 mmol/L (ref 96–106)
Creatinine, Ser: 0.89 mg/dL (ref 0.76–1.27)
GFR calc Af Amer: 117 mL/min/{1.73_m2} (ref >59)
GFR calc non Af Amer: 101 mL/min/{1.73_m2} (ref >59)
Globulin, Total: 2.3 g/dL (ref 1.5–4.5)
Glucose: 192 mg/dL — ABNORMAL HIGH (ref 65–99)
Potassium: 4.3 mmol/L (ref 3.5–5.2)
Sodium: 137 mmol/L (ref 134–144)
Total Protein: 7 g/dL (ref 6.0–8.5)

## 2019-01-09 LAB — LIPID PANEL
Chol/HDL Ratio: 3.1 ratio (ref 0.0–5.0)
Cholesterol, Total: 124 mg/dL (ref 100–199)
HDL: 40 mg/dL (ref >39)
LDL Chol Calc (NIH): 58 mg/dL (ref 0–99)
Triglycerides: 153 mg/dL — ABNORMAL HIGH (ref 0–149)
VLDL Cholesterol Cal: 26 mg/dL (ref 5–40)

## 2019-01-13 ENCOUNTER — Ambulatory Visit (INDEPENDENT_AMBULATORY_CARE_PROVIDER_SITE_OTHER): Payer: BC Managed Care – PPO | Admitting: Family Medicine

## 2019-01-13 DIAGNOSIS — G4701 Insomnia due to medical condition: Secondary | ICD-10-CM

## 2019-01-13 DIAGNOSIS — E119 Type 2 diabetes mellitus without complications: Secondary | ICD-10-CM | POA: Diagnosis not present

## 2019-01-13 DIAGNOSIS — I10 Essential (primary) hypertension: Secondary | ICD-10-CM | POA: Diagnosis not present

## 2019-01-13 MED ORDER — ZALEPLON 10 MG PO CAPS: 10.0000 mg | ORAL_CAPSULE | Freq: Every evening | ORAL | 3 refills | Status: DC | PRN

## 2019-01-13 MED ORDER — PIOGLITAZONE HCL 45 MG PO TABS: 45.0000 mg | ORAL_TABLET | Freq: Every day | ORAL | 2 refills | Status: DC

## 2019-01-13 NOTE — Progress Notes (Signed)
Patient: Preston Nguyen Male    DOB: 1970/03/25   48 y.o.   MRN: [id number] Visit Date: 01/13/2019  Today's Provider: Lelon Huh, MD   Chief Complaint  Patient presents with  . Diabetes   Subjective:    Interactive audio and video communications were attempted, although failed due to patient's inability to connect to video. Continued visit with audio only interaction with patient agreement.  Virtual Visit Note  I connected with Preston Nguyen on 01/13/19 at 10:40 AM EST by telephone and verified that I am speaking with the correct person using two identifiers.  Location: Patient: Home Provider: [facility]   I discussed the limitations, risks, security and privacy concerns of performing an evaluation and management service by telephone and the availability of in person appointments. I also discussed with the patient that there may be a patient responsible charge related to this service. The patient expressed understanding and agreed to proceed.   HPI  Due for follow up lipids, diabetes, and insomnia. Had labs done last week as follows.  Results for orders placed or performed in visit on 01/06/19  Comprehensive metabolic panel  Result Value Ref Range   Glucose 192 (H) 65 - 99 mg/dL   BUN 26 (H) 6 - 24 mg/dL   Creatinine, Ser 0.89 0.76 - 1.27 mg/dL   GFR calc non Af Amer 101 >59 mL/min/1.73   GFR calc Af Amer 117 >59 mL/min/1.73   BUN/Creatinine Ratio 29 (H) 9 - 20   Sodium 137 134 - 144 mmol/L   Potassium 4.3 3.5 - 5.2 mmol/L   Chloride 97 96 - 106 mmol/L   CO2 20 20 - 29 mmol/L   Calcium 9.4 8.7 - 10.2 mg/dL   Total Protein 7.0 6.0 - 8.5 g/dL   Albumin 4.7 4.0 - 5.0 g/dL   Globulin, Total 2.3 1.5 - 4.5 g/dL   Albumin/Globulin Ratio 2.0 1.2 - 2.2   Bilirubin Total 0.6 0.0 - 1.2 mg/dL   Alkaline Phosphatase 68 39 - 117 IU/L   AST 27 0 - 40 IU/L   ALT 50 (H) 0 - 44 IU/L  Lipid panel (fasting)  Result Value Ref Range   Cholesterol,  Total 124 100 - 199 mg/dL   Triglycerides 153 (H) 0 - 149 mg/dL   HDL 40 >39 mg/dL   VLDL Cholesterol Cal 26 5 - 40 mg/dL   LDL Chol Calc (NIH) 58 0 - 99 mg/dL   Chol/HDL Ratio 3.1 0.0 - 5.0 ratio  HgB A1c  Result Value Ref Range   Hgb A1c MFr Bld 8.4 (H) 4.8 - 5.6 %   Est. average glucose Bld gHb Est-mCnc 194 mg/dL    Allergies  Allergen Reactions  . Erythromycin     Possible cause of GI bleed     Current Outpatient Medications:  .  aspirin 325 MG tablet, Take 1 tablet (325 mg total) by mouth daily., Disp: 90 tablet, Rfl: 1 .  atorvastatin (LIPITOR) 40 MG tablet, Take 1 tablet (40 mg total) by mouth daily., Disp: 90 tablet, Rfl: 2 .  fexofenadine (ALLEGRA) 180 MG tablet, Take 180 mg by mouth daily as needed for allergies. Dose unkn, Disp: , Rfl:  .  fluticasone (FLONASE) 50 MCG/ACT nasal spray, Place 2 sprays into both nostrils daily., Disp: , Rfl:  .  ibuprofen (ADVIL,MOTRIN) 600 MG tablet, as needed., Disp: , Rfl:  .  lisinopril-hydrochlorothiazide (ZESTORETIC) 10-12.5 MG tablet, Take 1 tablet by mouth  daily., Disp: 90 tablet, Rfl: 2 .  metFORMIN (GLUCOPHAGE-XR) 500 MG 24 hr tablet, Take 1 tablet (500 mg total) by mouth daily with breakfast., Disp: 90 tablet, Rfl: 3 .  methocarbamol (ROBAXIN) 500 MG tablet, TAKE 1 TABLET BY MOUTH 4 TIMES DAILY AS NEEDED, Disp: 90 tablet, Rfl: 1 .  Multiple Vitamin (MULTIVITAMIN WITH MINERALS) TABS tablet, Take 1 tablet by mouth daily., Disp: , Rfl:  .  pioglitazone (ACTOS) 45 MG tablet, Take 1 tablet (45 mg total) by mouth daily., Disp: 90 tablet, Rfl: 2 .  venlafaxine XR (EFFEXOR XR) 37.5 MG 24 hr capsule, Take 1 capsule (37.5 mg total) by mouth daily., Disp: 90 capsule, Rfl: 1 .  zaleplon (SONATA) 10 MG capsule, Take 1 capsule (10 mg total) by mouth at bedtime as needed for sleep., Disp: 30 capsule, Rfl: 3 .  zolpidem (AMBIEN) 10 MG tablet, Take 1 tablet (10 mg total) by mouth at bedtime as needed., Disp: 90 tablet, Rfl: 0  Is tolerating combo  of pioglitazone and metformin, but did not tolerate higher metformin dose in the past. Has not been following diet as well as in the past and is not excerising consistently. However he generally feels well.    Review of Systems  Constitutional: Negative for appetite change, chills and fever.  Respiratory: Negative for chest tightness, shortness of breath and wheezing.   Cardiovascular: Negative for chest pain and palpitations.  Gastrointestinal: Negative for abdominal pain, nausea and vomiting.    Social History   Tobacco Use  . Smoking status: Never Smoker  . Smokeless tobacco: Never Used  Substance Use Topics  . Alcohol use: Yes    Alcohol/week: 0.0 standard drinks    Comment: occasionally      Objective:   There were no vitals taken for this visit. There were no vitals filed for this visit.There is no height or weight on file to calculate BMI.   Physical Exam  Awake, alert, oriented x 3      Assessment & Plan    1. Type 2 diabetes mellitus without complication, without long-term current use of insulin (HCC) Aic improving, but still not to goal. Counseled regarding prudent diet and regular exercise.  Increase from 30mg  to - pioglitazone (ACTOS) 45 MG tablet; Take 1 tablet (45 mg total) by mouth daily.  Dispense: 90 tablet; Refill: 2  2. Essential hypertension, benign Recommend he monitor BP at home 2-3 x per week.   3. Insomnia due to medical condition Refill - zaleplon (SONATA) 10 MG capsule; Take 1 capsule (10 mg total) by mouth at bedtime as needed for sleep.  Dispense: 30 capsule; Refill: 3  Advised to schedule follow up in 3-4 months in the office to check a1c and blood pressure.     , MD  [facility]

## 2019-05-22 ENCOUNTER — Ambulatory Visit: Payer: Self-pay | Attending: Internal Medicine

## 2019-05-22 DIAGNOSIS — Z23 Encounter for immunization: Secondary | ICD-10-CM

## 2019-05-22 NOTE — Progress Notes (Signed)
   Covid-19 Vaccination Clinic  Name:  Preston Nguyen    MRN: [id number] DOB: [birth year]  05/22/2019  [name] was observed post Covid-19 immunization for 15 minutes without incident. He was provided with Vaccine Information Sheet and instruction to access the V-Safe system.   [name] was instructed to call 911 with any severe reactions post vaccine: Marland Kitchen Difficulty breathing  . Swelling of face and throat  . A fast heartbeat  . A bad rash all over body  . Dizziness and weakness   Immunizations Administered    Name Date Dose VIS Date Route   Pfizer COVID-19 Vaccine 05/22/2019 12:15 PM 0.3 mL 01/24/2019 Intramuscular   Manufacturer: [employer], [employer]   Lot: [id number]   NDC: [address]-7

## 2019-06-02 ENCOUNTER — Other Ambulatory Visit: Payer: Self-pay | Admitting: Family Medicine

## 2019-06-02 DIAGNOSIS — E119 Type 2 diabetes mellitus without complications: Secondary | ICD-10-CM

## 2019-06-09 ENCOUNTER — Telehealth: Payer: Self-pay

## 2019-06-09 DIAGNOSIS — Z8673 Personal history of transient ischemic attack (TIA), and cerebral infarction without residual deficits: Secondary | ICD-10-CM

## 2019-06-09 DIAGNOSIS — E119 Type 2 diabetes mellitus without complications: Secondary | ICD-10-CM

## 2019-06-09 DIAGNOSIS — I10 Essential (primary) hypertension: Secondary | ICD-10-CM

## 2019-06-09 NOTE — Telephone Encounter (Signed)
Patient advised. He would like to have A1C added to the blood work. Labs signed. Patient plans to get labs done tomorrow morning before his appointment.

## 2019-06-09 NOTE — Telephone Encounter (Signed)
Copied from CRM [id number]. Topic: General - Inquiry >> Jun 09, 2019  8:53 AM Lynne Logan D wrote: Reason for CRM: Pt would like to have full panel labs done prior to appt tomorrow. Did not want to schedule CPE. Please advise.

## 2019-06-09 NOTE — Telephone Encounter (Signed)
Pended order for everything except A1c. We can do that in the office unless he'd rather have it checked with other labs.

## 2019-06-10 ENCOUNTER — Other Ambulatory Visit: Payer: Self-pay

## 2019-06-10 ENCOUNTER — Ambulatory Visit (INDEPENDENT_AMBULATORY_CARE_PROVIDER_SITE_OTHER): Payer: BC Managed Care – PPO | Admitting: Family Medicine

## 2019-06-10 ENCOUNTER — Encounter: Payer: Self-pay | Admitting: Family Medicine

## 2019-06-10 VITALS — BP 136/82 | HR 95 | Temp 96.9°F | Resp 16 | Wt 262.0 lb

## 2019-06-10 DIAGNOSIS — E669 Obesity, unspecified: Secondary | ICD-10-CM | POA: Diagnosis not present

## 2019-06-10 DIAGNOSIS — I10 Essential (primary) hypertension: Secondary | ICD-10-CM

## 2019-06-10 DIAGNOSIS — Z8673 Personal history of transient ischemic attack (TIA), and cerebral infarction without residual deficits: Secondary | ICD-10-CM | POA: Diagnosis not present

## 2019-06-10 DIAGNOSIS — G8929 Other chronic pain: Secondary | ICD-10-CM

## 2019-06-10 DIAGNOSIS — E119 Type 2 diabetes mellitus without complications: Secondary | ICD-10-CM | POA: Diagnosis not present

## 2019-06-10 DIAGNOSIS — M542 Cervicalgia: Secondary | ICD-10-CM | POA: Diagnosis not present

## 2019-06-10 MED ORDER — METHOCARBAMOL 500 MG PO TABS: ORAL_TABLET | ORAL | 2 refills | Status: DC

## 2019-06-10 NOTE — Progress Notes (Deleted)
error 

## 2019-06-10 NOTE — Patient Instructions (Addendum)
.   Please review the attached list of medications and notify my office if there are any errors.   . Please bring all of your medications to every appointment so we can make sure that our medication list is the same as yours.   . Please contact your eyecare professional to schedule a routine eye exam    

## 2019-06-10 NOTE — Progress Notes (Signed)
I,[name],acting as a scribe for [name], MD.,have documented all relevant documentation on the behalf of [name], MD,as directed by  [name], MD while in the presence of [name], MD.  Established patient visit   Patient: Preston Nguyen   DOB: January 22, 1971   49 y.o. Male  MRN: [id number] Visit Date: 06/10/2019  Today's healthcare provider: [name], MD   Chief Complaint  Patient presents with  . Diabetes  . Hypertension  . Insomnia   Subjective    HPI Diabetes Mellitus Type II, Follow-up  Lab Results  Component Value Date   HGBA1C 8.4 (H) 01/08/2019   HGBA1C 8.7 (H) 07/11/2018   HGBA1C 8.5 (H) 10/30/2017   Last seen for diabetes 5 months ago.  Management since then includes increasing Actos from 30mg  to 45 mg daily. Counseled regarding prudent diet and regular exercise.   He reports good compliance with treatment. He is not having side effects.  Symptoms: No fatigue No foot ulcerations No appetite changes No nausea No paresthesia (numbness or tingling) of the feet  No polydipsia (excessive thirst) No polyuria (frequent urination) No visual disturbances  No vomiting  Home blood sugar records: blood sugars are not checked  Episodes of hypoglycemia? No    Current insulin regiment: none Most Recent Eye Exam: 10 months ago Current exercise: cardiovascular workout on exercise equipment Current diet habits: in general, an "unhealthy" diet  Pertinent Labs: Lab Results  Component Value Date   CHOL 124 01/08/2019   HDL 40 01/08/2019   LDLCALC 58 01/08/2019   TRIG 153 (H) 01/08/2019   CHOLHDL 3.1 01/08/2019   Lab Results  Component Value Date   NA 137 01/08/2019   K 4.3 01/08/2019   CO2 20 01/08/2019   GLUCOSE 192 (H) 01/08/2019   BUN 26 (H) 01/08/2019   CREATININE 0.89 01/08/2019   CALCIUM 9.4 01/08/2019   GFRNONAA 101 01/08/2019   GFRAA 117 01/08/2019     Wt Readings from Last 3 Encounters:  06/10/19 262  lb (118.8 kg)  10/26/17 268 lb (121.6 kg)  11/16/16 254 lb (115.2 kg)    ----------------------------------------------------------------------------------------- Hypertension, follow-up  BP Readings from Last 3 Encounters:  06/10/19 136/82  10/26/17 133/80  11/16/16 140/80   Wt Readings from Last 3 Encounters:  06/10/19 262 lb (118.8 kg)  10/26/17 268 lb (121.6 kg)  11/16/16 254 lb (115.2 kg)     He was last seen for hypertension 5 months ago.  BP at that visit was 120/76. Management since that visit includes continuing same medication. Also recommend monitoring BP at home 2-3 x per week.   He reports good compliance with treatment. He is not having side effects.  He is following a Regular diet. He is exercising. He does not smoke.  Use of agents associated with hypertension: none.   Outside blood pressures are 160/90 at the pharmacy.  Symptoms:  YES NO    []    [x]    Chest Pain   []    [x]    Chest pressure/discomfort   []    [x]    Palpitations   []    [x]    Dyspnea   []    [x]    Orthopnea   []    [x]    Paroxysmal nocturnal dyspnea   []   [x]    Lower extremity edema   []    [x]   Syncope   Pertinent labs: Lab Results  Component Value Date   CHOL 124 01/08/2019   HDL 40 01/08/2019  LDLCALC 58 01/08/2019   TRIG 153 (H) 01/08/2019   CHOLHDL 3.1 01/08/2019   Lab Results  Component Value Date   NA 137 01/08/2019   K 4.3 01/08/2019   CO2 20 01/08/2019   GLUCOSE 192 (H) 01/08/2019   BUN 26 (H) 01/08/2019   CREATININE 0.89 01/08/2019   CALCIUM 9.4 01/08/2019   GFRNONAA 101 01/08/2019   GFRAA 117 01/08/2019     The ASCVD Risk score (Goff DC Jr., et al., 2013) failed to calculate for the following reasons:   The valid total cholesterol range is 130 to 320 mg/dL   --------------------------------------------------------------------------------------------------- Follow up for Insomnia:  The patient was last seen for this 5 months ago. Changes made at last visit  include none; continue same medication.  He reports good compliance with treatment. He feels that condition is Improved. He is not having side effects.   -----------------------------------------------------------------------------------------      Medications: Outpatient Medications Prior to Visit  Medication Sig  . aspirin 325 MG tablet Take 1 tablet (325 mg total) by mouth daily.  Marland Kitchen atorvastatin (LIPITOR) 40 MG tablet Take 1 tablet (40 mg total) by mouth daily.  . fexofenadine (ALLEGRA) 180 MG tablet Take 180 mg by mouth daily as needed for allergies. Dose unkn  . fluticasone (FLONASE) 50 MCG/ACT nasal spray Place 2 sprays into both nostrils daily.  Marland Kitchen ibuprofen (ADVIL,MOTRIN) 600 MG tablet as needed.  Marland Kitchen lisinopril-hydrochlorothiazide (ZESTORETIC) 10-12.5 MG tablet Take 1 tablet by mouth daily.  . metFORMIN (GLUCOPHAGE-XR) 500 MG 24 hr tablet Take 1 tablet by mouth once daily with breakfast  . methocarbamol (ROBAXIN) 500 MG tablet TAKE 1 TABLET BY MOUTH 4 TIMES DAILY AS NEEDED  . Multiple Vitamin (MULTIVITAMIN WITH MINERALS) TABS tablet Take 1 tablet by mouth daily.  . pioglitazone (ACTOS) 45 MG tablet Take 1 tablet (45 mg total) by mouth daily.  Marland Kitchen venlafaxine XR (EFFEXOR XR) 37.5 MG 24 hr capsule Take 1 capsule (37.5 mg total) by mouth daily.  . zaleplon (SONATA) 10 MG capsule Take 1 capsule (10 mg total) by mouth at bedtime as needed for sleep.  Marland Kitchen zolpidem (AMBIEN) 10 MG tablet Take 1 tablet (10 mg total) by mouth at bedtime as needed.   No facility-administered medications prior to visit.    Review of Systems  Constitutional: Negative for appetite change, chills and fever.  Respiratory: Negative for chest tightness, shortness of breath and wheezing.   Cardiovascular: Positive for palpitations (occasional heart flutter ). Negative for chest pain.  Gastrointestinal: Negative for abdominal pain, nausea and vomiting.       Objective    BP 136/82 (BP Location: Right Arm,  Cuff Size: Large)   Pulse 95   Temp (!) 96.9 F (36.1 C) (Temporal)   Resp 16   Wt 262 lb (118.8 kg)   SpO2 97% Comment: room air  BMI 34.57 kg/m    Physical Exam   General: Appearance:    Obese male in no acute distress  Eyes:    PERRL, conjunctiva/corneas clear, EOM's intact       Lungs:     Clear to auscultation bilaterally, respirations unlabored  Heart:    Normal heart rate. Normal rhythm. No murmurs, rubs, or gallops.   MS:   All extremities are intact.   Neurologic:   Awake, alert, oriented x 3. No apparent focal neurological           defect.        No results found for any visits on  06/10/19.  Assessment & Plan    1. Chronic neck pain Refill provided - methocarbamol (ROBAXIN) 500 MG tablet; TAKE 1 TABLET BY MOUTH 4 TIMES DAILY AS NEEDED  Dispense: 90 tablet; Refill: 2  2. Type 2 diabetes mellitus without complication, without long-term current use of insulin (HCC) Doing well current medications. Blood drawn this morning, awaiting pending labs.  3. Obesity (BMI 30-39.9) Continue work on diet and exercise.   4. Essential hypertension, benign Controlled. Continue same medication.  Recommend monitoring blood pressure twice a week.         The entirety of the information documented in the History of Present Illness, Review of Systems and Physical Exam were personally obtained by me. Portions of this information were initially documented by the CMA and reviewed by me for thoroughness and accuracy.      Lelon Huh, MD  [facility] [phone] (phone) [phone] (fax)  [place]

## 2019-06-11 LAB — COMPREHENSIVE METABOLIC PANEL
ALT: 38 IU/L (ref 0–44)
AST: 21 IU/L (ref 0–40)
Albumin/Globulin Ratio: 2.5 — ABNORMAL HIGH (ref 1.2–2.2)
Albumin: 4.9 g/dL (ref 4.0–5.0)
Alkaline Phosphatase: 68 IU/L (ref 39–117)
BUN/Creatinine Ratio: 26 — ABNORMAL HIGH (ref 9–20)
BUN: 27 mg/dL — ABNORMAL HIGH (ref 6–24)
Bilirubin Total: 0.5 mg/dL (ref 0.0–1.2)
CO2: 24 mmol/L (ref 20–29)
Calcium: 9.8 mg/dL (ref 8.7–10.2)
Chloride: 97 mmol/L (ref 96–106)
Creatinine, Ser: 1.05 mg/dL (ref 0.76–1.27)
GFR calc Af Amer: 96 mL/min/{1.73_m2} (ref >59)
GFR calc non Af Amer: 83 mL/min/{1.73_m2} (ref >59)
Globulin, Total: 2 g/dL (ref 1.5–4.5)
Glucose: 209 mg/dL — ABNORMAL HIGH (ref 65–99)
Potassium: 4.4 mmol/L (ref 3.5–5.2)
Sodium: 135 mmol/L (ref 134–144)
Total Protein: 6.9 g/dL (ref 6.0–8.5)

## 2019-06-11 LAB — CBC
Hematocrit: 48 % (ref 37.5–51.0)
Hemoglobin: 16.3 g/dL (ref 13.0–17.7)
MCH: 30.1 pg (ref 26.6–33.0)
MCHC: 34 g/dL (ref 31.5–35.7)
MCV: 89 fL (ref 79–97)
Platelets: 274 10*3/uL (ref 150–450)
RBC: 5.41 x10E6/uL (ref 4.14–5.80)
RDW: 12.8 % (ref 11.6–15.4)
WBC: 7.4 10*3/uL (ref 3.4–10.8)

## 2019-06-11 LAB — LIPID PANEL
Chol/HDL Ratio: 3.1 ratio (ref 0.0–5.0)
Cholesterol, Total: 127 mg/dL (ref 100–199)
HDL: 41 mg/dL (ref >39)
LDL Chol Calc (NIH): 65 mg/dL (ref 0–99)
Triglycerides: 115 mg/dL (ref 0–149)
VLDL Cholesterol Cal: 21 mg/dL (ref 5–40)

## 2019-06-11 LAB — HEMOGLOBIN A1C
Est. average glucose Bld gHb Est-mCnc: 220 mg/dL
Hgb A1c MFr Bld: 9.3 % — ABNORMAL HIGH (ref 4.8–5.6)

## 2019-06-17 ENCOUNTER — Ambulatory Visit: Payer: Self-pay | Attending: Internal Medicine

## 2019-06-17 DIAGNOSIS — Z23 Encounter for immunization: Secondary | ICD-10-CM

## 2019-06-17 NOTE — Progress Notes (Signed)
   Covid-19 Vaccination Clinic  Name:  Preston Nguyen    MRN: [id number] DOB: Jun 22, 1970  06/17/2019  [name] was observed post Covid-19 immunization for 15 minutes without incident. He was provided with Vaccine Information Sheet and instruction to access the V-Safe system.   [name] was instructed to call 911 with any severe reactions post vaccine: Marland Kitchen Difficulty breathing  . Swelling of face and throat  . A fast heartbeat  . A bad rash all over body  . Dizziness and weakness   Immunizations Administered    Name Date Dose VIS Date Route   Pfizer COVID-19 Vaccine 06/17/2019 12:18 PM 0.3 mL 04/09/2018 Intramuscular   Manufacturer: [employer], [employer]   Lot: [id number]   NDC: [address]-4

## 2019-07-01 ENCOUNTER — Other Ambulatory Visit: Payer: Self-pay | Admitting: Family Medicine

## 2019-07-01 DIAGNOSIS — F430 Acute stress reaction: Secondary | ICD-10-CM

## 2019-07-01 NOTE — Telephone Encounter (Signed)
Requested Prescriptions  Pending Prescriptions Disp Refills  . venlafaxine XR (EFFEXOR-XR) 37.5 MG 24 hr capsule [Pharmacy Med Name: VENLAFAXINE HCL ER 37.5 MG CAP] 90 capsule 1    Sig: TAKE 1 CAPSULE BY MOUTH EVERY DAY     Psychiatry: Antidepressants - SNRI - desvenlafaxine & venlafaxine Failed - 07/01/2019  1:29 AM      Failed - LDL in normal range and within 360 days    LDL Chol Calc (NIH)  Date Value Ref Range Status  06/10/2019 65 0 - 99 mg/dL Final         Passed - Total Cholesterol in normal range and within 360 days    Cholesterol, Total  Date Value Ref Range Status  06/10/2019 127 100 - 199 mg/dL Final         Passed - Triglycerides in normal range and within 360 days    Triglycerides  Date Value Ref Range Status  06/10/2019 115 0 - 149 mg/dL Final         Passed - Last BP in normal range    BP Readings from Last 1 Encounters:  06/10/19 136/82         Passed - Valid encounter within last 6 months    Recent Outpatient Visits          3 weeks ago Chronic neck pain   [facility] Malva Limes, MD   5 months ago Type 2 diabetes mellitus without complication, without long-term current use of insulin (HCC)   [facility] Malva Limes, MD   1 year ago Type 2 diabetes mellitus without complication, without long-term current use of insulin [facility])   [facility] Malva Limes, MD   1 year ago Annual physical exam   [facility] Malva Limes, MD   2 years ago Type 2 diabetes mellitus without complication, without long-term current use of insulin [facility])   [facility] Malva Limes, MD      Future Appointments            In 4 months Fisher, Demetrios Isaacs, MD [facility]

## 2019-08-04 ENCOUNTER — Other Ambulatory Visit: Payer: Self-pay | Admitting: Family Medicine

## 2019-08-04 DIAGNOSIS — G4701 Insomnia due to medical condition: Secondary | ICD-10-CM

## 2019-08-04 NOTE — Telephone Encounter (Signed)
Requested medications are due for refill today?  Yes - This medication refill cannot be delegated.    Requested medications are on active medication list? Yes  Last Refill:  11/26/2018 # 90 with no refills   Future visit scheduled?  Yes in 3 months.    Notes to Clinic:  This medication refill cannot be delegated.

## 2019-08-09 ENCOUNTER — Other Ambulatory Visit: Payer: Self-pay | Admitting: Family Medicine

## 2019-08-09 DIAGNOSIS — Z8673 Personal history of transient ischemic attack (TIA), and cerebral infarction without residual deficits: Secondary | ICD-10-CM

## 2019-08-09 NOTE — Telephone Encounter (Signed)
Requested Prescriptions  Pending Prescriptions Disp Refills  . atorvastatin (LIPITOR) 40 MG tablet [Pharmacy Med Name: ATORVASTATIN 40 MG TABLET] 90 tablet 2    Sig: TAKE 1 TABLET BY MOUTH EVERY DAY     Cardiovascular:  Antilipid - Statins Failed - 08/09/2019  9:39 AM      Failed - LDL in normal range and within 360 days    LDL Chol Calc (NIH)  Date Value Ref Range Status  06/10/2019 65 0 - 99 mg/dL Final         Passed - Total Cholesterol in normal range and within 360 days    Cholesterol, Total  Date Value Ref Range Status  06/10/2019 127 100 - 199 mg/dL Final         Passed - HDL in normal range and within 360 days    HDL  Date Value Ref Range Status  06/10/2019 41 >39 mg/dL Final         Passed - Triglycerides in normal range and within 360 days    Triglycerides  Date Value Ref Range Status  06/10/2019 115 0 - 149 mg/dL Final         Passed - Patient is not pregnant      Passed - Valid encounter within last 12 months    Recent Outpatient Visits          2 months ago Chronic neck pain   [facility] Malva Limes, MD   6 months ago Type 2 diabetes mellitus without complication, without long-term current use of insulin (HCC)   [facility] Malva Limes, MD   1 year ago Type 2 diabetes mellitus without complication, without long-term current use of insulin [facility])   [facility] Malva Limes, MD   1 year ago Annual physical exam   [facility] Malva Limes, MD   2 years ago Type 2 diabetes mellitus without complication, without long-term current use of insulin [facility])   [facility] Malva Limes, MD      Future Appointments            In 3 months Fisher, Demetrios Isaacs, MD [facility]

## 2019-08-19 ENCOUNTER — Other Ambulatory Visit: Payer: Self-pay | Admitting: Family Medicine

## 2019-08-19 DIAGNOSIS — I10 Essential (primary) hypertension: Secondary | ICD-10-CM

## 2019-08-19 NOTE — Telephone Encounter (Signed)
Requested Prescriptions  Pending Prescriptions Disp Refills  . lisinopril-hydrochlorothiazide (ZESTORETIC) 10-12.5 MG tablet [Pharmacy Med Name: LISINOPRIL-HCTZ 10-12.5 MG TAB] 90 tablet 1    Sig: TAKE 1 TABLET BY MOUTH EVERY DAY     Cardiovascular:  ACEI + Diuretic Combos Passed - 08/19/2019  9:46 PM      Passed - Na in normal range and within 180 days    Sodium  Date Value Ref Range Status  06/10/2019 135 134 - 144 mmol/L Final  06/04/2013 138 136 - 145 mmol/L Final         Passed - K in normal range and within 180 days    Potassium  Date Value Ref Range Status  06/10/2019 4.4 3.5 - 5.2 mmol/L Final  06/04/2013 3.6 3.5 - 5.1 mmol/L Final         Passed - Cr in normal range and within 180 days    Creatinine  Date Value Ref Range Status  06/04/2013 1.17 0.60 - 1.30 mg/dL Final   Creatinine, Ser  Date Value Ref Range Status  06/10/2019 1.05 0.76 - 1.27 mg/dL Final         Passed - Ca in normal range and within 180 days    Calcium  Date Value Ref Range Status  06/10/2019 9.8 8.7 - 10.2 mg/dL Final   Calcium, Total  Date Value Ref Range Status  06/04/2013 8.6 8.5 - 10.1 mg/dL Final         Passed - Patient is not pregnant      Passed - Last BP in normal range    BP Readings from Last 1 Encounters:  06/10/19 136/82         Passed - Valid encounter within last 6 months    Recent Outpatient Visits          2 months ago Chronic neck pain   [facility] Malva Limes, MD   7 months ago Type 2 diabetes mellitus without complication, without long-term current use of insulin (HCC)   [facility] Malva Limes, MD   1 year ago Type 2 diabetes mellitus without complication, without long-term current use of insulin [facility])   [facility] Malva Limes, MD   1 year ago Annual physical exam   [facility] Malva Limes, MD   2 years ago Type 2 diabetes mellitus without complication, without long-term current  use of insulin [facility])   [facility] Malva Limes, MD      Future Appointments            In 2 months Fisher, Demetrios Isaacs, MD [facility]

## 2019-09-01 ENCOUNTER — Other Ambulatory Visit: Payer: Self-pay | Admitting: Family Medicine

## 2019-09-01 DIAGNOSIS — E119 Type 2 diabetes mellitus without complications: Secondary | ICD-10-CM

## 2019-10-03 ENCOUNTER — Other Ambulatory Visit: Payer: Self-pay | Admitting: Family Medicine

## 2019-10-03 DIAGNOSIS — E119 Type 2 diabetes mellitus without complications: Secondary | ICD-10-CM

## 2019-11-11 NOTE — Progress Notes (Signed)
I,Preston Nguyen,acting as a scribe for Preston Merry, MD.,have documented all relevant documentation on the behalf of Preston Merry, MD,as directed by  Preston Merry, MD while in the presence of Preston Merry, MD.  Established patient visit   Patient: Preston Nguyen   DOB: 06-18-1970   49 y.o. Male  MRN: [id number] Visit Date: 11/12/2019  Today's healthcare provider: [name], MD   Chief Complaint  Patient presents with  . Diabetes  . Hypertension   Subjective    HPI  Diabetes Mellitus Type II, Follow-up  Lab Results  Component Value Date   HGBA1C 9.3 (H) 06/10/2019   HGBA1C 8.4 (H) 01/08/2019   HGBA1C 8.7 (H) 07/11/2018   Wt Readings from Last 3 Encounters:  11/12/19 259 lb (117.5 kg)  06/10/19 262 lb (118.8 kg)  10/26/17 268 lb (121.6 kg)   Last seen for diabetes 4 months ago.  Management since then includes doubling metformin to 2 x 500mg  tablets daily He reports poor compliance with treatment. Patient says he didn't get the message to increase Metformin. He is not having side effects.  Symptoms: No fatigue No foot ulcerations  No appetite changes No nausea  No paresthesia of the feet  No polydipsia  No polyuria Yes visual disturbances   No vomiting     Home blood sugar records: blood sugars are not checked  Episodes of hypoglycemia? No    Current insulin regiment: none Most Recent Eye Exam: not UTD Current exercise: spin class once a week Current diet habits: in general, an "unhealthy" diet  Pertinent Labs: Lab Results  Component Value Date   CHOL 127 06/10/2019   HDL 41 06/10/2019   LDLCALC 65 06/10/2019   TRIG 115 06/10/2019   CHOLHDL 3.1 06/10/2019   Lab Results  Component Value Date   NA 135 06/10/2019   K 4.4 06/10/2019   CREATININE 1.05 06/10/2019   GFRNONAA 83 06/10/2019   GFRAA 96 06/10/2019   GLUCOSE 209 (H) 06/10/2019     Hypertension, follow-up  BP Readings from Last 3 Encounters:  11/12/19 119/79  06/10/19  136/82  10/26/17 133/80   Wt Readings from Last 3 Encounters:  11/12/19 259 lb (117.5 kg)  06/10/19 262 lb (118.8 kg)  10/26/17 268 lb (121.6 kg)     He was last seen for hypertension 4 months ago.  BP at that visit was 136/82. Management since that visit includes no changes.  He reports good compliance with treatment. He is not having side effects.  He is following a Regular diet. He is exercising. He does not smoke.  Use of agents associated with hypertension: NSAIDS.   Outside blood pressures are not checked. Symptoms: No chest pain No chest pressure  No palpitations No syncope  No dyspnea No orthopnea  No paroxysmal nocturnal dyspnea No lower extremity edema    The ASCVD Risk score 10/28/17 DC Jr., et al., 2013) failed to calculate for the following reasons:   The valid total cholesterol range is 130 to 320 mg/dL       Medications: Outpatient Medications Prior to Visit  Medication Sig  . aspirin 325 MG tablet Take 1 tablet (325 mg total) by mouth daily.  2014 atorvastatin (LIPITOR) 40 MG tablet TAKE 1 TABLET BY MOUTH EVERY DAY  . fexofenadine (ALLEGRA) 180 MG tablet Take 180 mg by mouth daily as needed for allergies. Dose unkn  . fluticasone (FLONASE) 50 MCG/ACT nasal spray Place 2 sprays into both nostrils daily.  Marland Kitchen ibuprofen (  ADVIL,MOTRIN) 600 MG tablet as needed.  Marland Kitchen lisinopril-hydrochlorothiazide (ZESTORETIC) 10-12.5 MG tablet TAKE 1 TABLET BY MOUTH EVERY DAY  . metFORMIN (GLUCOPHAGE-XR) 500 MG 24 hr tablet Take 1 tablet by mouth once daily with breakfast  . methocarbamol (ROBAXIN) 500 MG tablet TAKE 1 TABLET BY MOUTH 4 TIMES DAILY AS NEEDED  . Multiple Vitamin (MULTIVITAMIN WITH MINERALS) TABS tablet Take 1 tablet by mouth daily.  . pioglitazone (ACTOS) 45 MG tablet TAKE 1 TABLET BY MOUTH EVERY DAY  . venlafaxine XR (EFFEXOR-XR) 37.5 MG 24 hr capsule TAKE 1 CAPSULE BY MOUTH EVERY DAY  . zaleplon (SONATA) 10 MG capsule Take 1 capsule (10 mg total) by mouth at bedtime as  needed for sleep.  Marland Kitchen zolpidem (AMBIEN) 10 MG tablet TAKE 1 TABLET (10 MG TOTAL) BY MOUTH AT BEDTIME AS NEEDED.   No facility-administered medications prior to visit.    Review of Systems  Constitutional: Negative.  Negative for appetite change, chills and fever.  Respiratory: Negative.  Negative for chest tightness, shortness of breath and wheezing.   Cardiovascular: Negative.  Negative for chest pain and palpitations.  Gastrointestinal: Negative for abdominal pain, nausea and vomiting.  Endocrine: Negative.   Neurological: Negative.        Objective    BP 119/79 (BP Location: Left Arm, Patient Position: Sitting, Cuff Size: Large)   Pulse 97   Temp 98.5 F (36.9 C) (Oral)   Resp 16   Ht 6\' 1"  (1.854 m)   Wt 259 lb (117.5 kg)   BMI 34.17 kg/m     Physical Exam   General appearance: Obese male, cooperative and in no acute distress Head: Normocephalic, without obvious abnormality, atraumatic Respiratory: Respirations even and unlabored, normal respiratory rate Extremities: All extremities are intact.  Skin: Skin color, texture, turgor normal. No rashes seen  Psych: Appropriate mood and affect. Neurologic: Mental status: Alert, oriented to person, place, and time, thought content appropriate.   Results for orders placed or performed in visit on 11/12/19  POCT glycosylated hemoglobin (Hb A1C)  Result Value Ref Range   Hemoglobin A1C 9.0 (A) 4.0 - 5.6 %   Est. average glucose Bld gHb Est-mCnc 212     Assessment & Plan     ,1. Type 2 diabetes mellitus without complication, without long-term current use of insulin (HCC) Uncontrolled, he has just recently started working out at a gym and improving his diet. Will double- metFORMIN (GLUCOPHAGE-XR) 500 MG 24 hr tablet; to 2 tablets (1,000 mg total) by mouth every evening.  Dispense: 180 tablet; Refill: 4  2. Essential hypertension, benign Well controlled.  Continue current medications.    3. Need for influenza  vaccination  - Flu Vaccine QUAD 36+ mos IM (Fluarix/Fluzone)        The entirety of the information documented in the History of Present Illness, Review of Systems and Physical Exam were personally obtained by me. Portions of this information were initially documented by the CMA and reviewed by me for thoroughness and accuracy.      11/14/19, MD  [facility] [phone] (phone) [phone] (fax)  [facility]

## 2019-11-12 ENCOUNTER — Other Ambulatory Visit: Payer: Self-pay

## 2019-11-12 ENCOUNTER — Encounter: Payer: Self-pay | Admitting: Family Medicine

## 2019-11-12 ENCOUNTER — Ambulatory Visit: Payer: BC Managed Care – PPO | Admitting: Family Medicine

## 2019-11-12 VITALS — BP 119/79 | HR 97 | Temp 98.5°F | Resp 16 | Ht 73.0 in | Wt 259.0 lb

## 2019-11-12 DIAGNOSIS — Z23 Encounter for immunization: Secondary | ICD-10-CM

## 2019-11-12 DIAGNOSIS — I10 Essential (primary) hypertension: Secondary | ICD-10-CM

## 2019-11-12 DIAGNOSIS — E119 Type 2 diabetes mellitus without complications: Secondary | ICD-10-CM | POA: Diagnosis not present

## 2019-11-12 LAB — POCT GLYCOSYLATED HEMOGLOBIN (HGB A1C)
Est. average glucose Bld gHb Est-mCnc: 212
Hemoglobin A1C: 9 % — AB (ref 4.0–5.6)

## 2019-11-12 MED ORDER — METFORMIN HCL ER 500 MG PO TB24: 1000.0000 mg | ORAL_TABLET | Freq: Every evening | ORAL | 4 refills | Status: DC

## 2019-11-12 NOTE — Patient Instructions (Addendum)
.   Increase metformin to 2 tablets every evening  . Please contact your eyecare professional to schedule a routine eye exam

## 2019-12-09 DIAGNOSIS — Z20822 Contact with and (suspected) exposure to covid-19: Secondary | ICD-10-CM | POA: Diagnosis not present

## 2020-01-23 ENCOUNTER — Ambulatory Visit: Payer: Self-pay

## 2020-01-23 NOTE — Telephone Encounter (Signed)
Pt. Did a home COVID 19 test - positive. States he "feels like I just have a cold." Reviewed home treatment and quarantine recommendations. Declines virtual visit at this time.Verbalizes understanding.  Reason for Disposition . [1] COVID-19 diagnosed by positive lab test AND [2] mild symptoms (e.g., cough, fever, others) AND [3] no complications or SOB  Answer Assessment - Initial Assessment Questions 1. COVID-19 DIAGNOSIS: "Who made your Coronavirus (COVID-19) diagnosis?" "Was it confirmed by a positive lab test?" If not diagnosed by a HCP, ask "Are there lots of cases (community spread) where you live?" (See public health department website, if unsure)     2 days ago 2. COVID-19 EXPOSURE: "Was there any known exposure to COVID before the symptoms began?" CDC Definition of close contact: within 6 feet (2 meters) for a total of 15 minutes or more over a 24-hour period.      No 3. ONSET: "When did the COVID-19 symptoms start?"      2 days ago 4. WORST SYMPTOM: "What is your worst symptom?" (e.g., cough, fever, shortness of breath, muscle aches)     Sore throat and cough 5. COUGH: "Do you have a cough?" If Yes, ask: "How bad is the cough?"       Yes -  6. FEVER: "Do you have a fever?" If Yes, ask: "What is your temperature, how was it measured, and when did it start?"     No 7. RESPIRATORY STATUS: "Describe your breathing?" (e.g., shortness of breath, wheezing, unable to speak)      No 8. BETTER-SAME-WORSE: "Are you getting better, staying the same or getting worse compared to yesterday?"  If getting worse, ask, "In what way?"     Worse 9. HIGH RISK DISEASE: "Do you have any chronic medical problems?" (e.g., asthma, heart or lung disease, weak immune system, obesity, etc.)     Diabetes 10. PREGNANCY: "Is there any chance you are pregnant?" "When was your last menstrual period?"       n/a 11. OTHER SYMPTOMS: "Do you have any other symptoms?"  (e.g., chills, fatigue, headache, loss of smell or  taste, muscle pain, sore throat; new loss of smell or taste especially support the diagnosis of COVID-19)       Sore throat, fatigue, achy  Protocols used: CORONAVIRUS (COVID-19) DIAGNOSED OR SUSPECTED-A-AH

## 2020-01-29 ENCOUNTER — Telehealth (INDEPENDENT_AMBULATORY_CARE_PROVIDER_SITE_OTHER): Payer: BC Managed Care – PPO | Admitting: Physician Assistant

## 2020-01-29 DIAGNOSIS — J40 Bronchitis, not specified as acute or chronic: Secondary | ICD-10-CM | POA: Diagnosis not present

## 2020-01-29 DIAGNOSIS — U071 COVID-19: Secondary | ICD-10-CM

## 2020-01-29 MED ORDER — PREDNISONE 20 MG PO TABS: 20.0000 mg | ORAL_TABLET | Freq: Every day | ORAL | 0 refills | Status: AC

## 2020-01-29 MED ORDER — ALBUTEROL SULFATE HFA 108 (90 BASE) MCG/ACT IN AERS: 2.0000 | INHALATION_SPRAY | Freq: Four times a day (QID) | RESPIRATORY_TRACT | 2 refills | Status: AC | PRN

## 2020-01-29 NOTE — Progress Notes (Signed)
MyChart Video Visit    Virtual Visit via Video Note   This visit type was conducted due to national recommendations for restrictions regarding the COVID-19 Pandemic (e.g. social distancing) in an effort to limit this patient's exposure and mitigate transmission in our community. This patient is at least at moderate risk for complications without adequate follow up. This format is felt to be most appropriate for this patient at this time. Physical exam was limited by quality of the video and audio technology used for the visit.   Patient location: Home Provider location: Office   I discussed the limitations of evaluation and management by telemedicine and the availability of in person appointments. The patient expressed understanding and agreed to proceed.  Patient: Preston Nguyen   DOB: November 09, 1970   49 y.o. Male  MRN: [id number] Visit Date: 01/29/2020  Today's healthcare provider: Trey Sailors, PA-C   Chief Complaint  Patient presents with  . Cough  I,Porsha C McClurkin,acting as a scribe for Trey Sailors, PA-C.,have documented all relevant documentation on the behalf of Trey Sailors, PA-C,as directed by  Trey Sailors, PA-C while in the presence of Trey Sailors, PA-C.  Subjective    Cough This is a new problem. The current episode started in the past 7 days. The problem has been unchanged. The cough is productive of sputum. Associated symptoms include nasal congestion, postnasal drip and shortness of breath. Pertinent negatives include no chills, ear congestion, ear pain, fever, headaches, rhinorrhea, sore throat or wheezing. He has tried OTC cough suppressant for the symptoms. The treatment provided mild relief.   Last Friday,01/23/2020 tested positive for COVID. Reports symptoms feel a little worse today in terms of nasal congestion, chest congestion, and some SOB.      Medications: Outpatient Medications Prior to Visit  Medication Sig  . aspirin 325  MG tablet Take 1 tablet (325 mg total) by mouth daily.  Marland Kitchen atorvastatin (LIPITOR) 40 MG tablet TAKE 1 TABLET BY MOUTH EVERY DAY  . fexofenadine (ALLEGRA) 180 MG tablet Take 180 mg by mouth daily as needed for allergies. Dose unkn  . fluticasone (FLONASE) 50 MCG/ACT nasal spray Place 2 sprays into both nostrils daily.  Marland Kitchen ibuprofen (ADVIL,MOTRIN) 600 MG tablet as needed.  Marland Kitchen lisinopril-hydrochlorothiazide (ZESTORETIC) 10-12.5 MG tablet TAKE 1 TABLET BY MOUTH EVERY DAY  . metFORMIN (GLUCOPHAGE-XR) 500 MG 24 hr tablet Take 2 tablets (1,000 mg total) by mouth every evening.  . methocarbamol (ROBAXIN) 500 MG tablet TAKE 1 TABLET BY MOUTH 4 TIMES DAILY AS NEEDED  . Multiple Vitamin (MULTIVITAMIN WITH MINERALS) TABS tablet Take 1 tablet by mouth daily.  . pioglitazone (ACTOS) 45 MG tablet TAKE 1 TABLET BY MOUTH EVERY DAY  . venlafaxine XR (EFFEXOR-XR) 37.5 MG 24 hr capsule TAKE 1 CAPSULE BY MOUTH EVERY DAY  . zolpidem (AMBIEN) 10 MG tablet TAKE 1 TABLET (10 MG TOTAL) BY MOUTH AT BEDTIME AS NEEDED.   No facility-administered medications prior to visit.    Review of Systems  Constitutional: Negative for chills and fever.  HENT: Positive for congestion and postnasal drip. Negative for ear pain, rhinorrhea and sore throat.   Respiratory: Positive for cough and shortness of breath. Negative for chest tightness and wheezing.   Neurological: Positive for weakness. Negative for headaches.      Objective    There were no vitals taken for this visit.   Physical Exam Constitutional:      Appearance: Normal appearance.  Pulmonary:  Effort: Pulmonary effort is normal. No respiratory distress.  Neurological:     Mental Status: He is alert.  Psychiatric:        Mood and Affect: Mood normal.        Behavior: Behavior normal.        Assessment & Plan    1. COVID-19  - predniSONE (DELTASONE) 20 MG tablet; Take 1 tablet (20 mg total) by mouth daily with breakfast for 5 days.  Dispense: 5 tablet;  Refill: 0 - albuterol (VENTOLIN HFA) 108 (90 Base) MCG/ACT inhaler; Inhale 2 puffs into the lungs every 6 (six) hours as needed for wheezing or shortness of breath.  Dispense: 1 each; Refill: 2  2. Bronchitis  Will treat as above. Counseled on transient increase of blood sugars with prednisone.    No follow-ups on file.     I discussed the assessment and treatment plan with the patient. The patient was provided an opportunity to ask questions and all were answered. The patient agreed with the plan and demonstrated an understanding of the instructions.   The patient was advised to call back or seek an in-person evaluation if the symptoms worsen or if the condition fails to improve as anticipated.   ITrey Sailors, PA-C, have reviewed all documentation for this visit. The documentation on 01/29/20 for the exam, diagnosis, procedures, and orders are all accurate and complete.  The entirety of the information documented in the History of Present Illness, Review of Systems and Physical Exam were personally obtained by me. Portions of this information were initially documented by [facility] and reviewed by me for thoroughness and accuracy.    Maryella Shivers [facility] [phone] (phone) [phone] (fax)  [facility]

## 2020-02-06 ENCOUNTER — Other Ambulatory Visit: Payer: Self-pay | Admitting: Family Medicine

## 2020-02-06 DIAGNOSIS — F430 Acute stress reaction: Secondary | ICD-10-CM

## 2020-02-06 DIAGNOSIS — I10 Essential (primary) hypertension: Secondary | ICD-10-CM

## 2020-02-10 LAB — HM DIABETES EYE EXAM

## 2020-02-11 ENCOUNTER — Encounter: Payer: Self-pay | Admitting: Family Medicine

## 2020-02-22 ENCOUNTER — Other Ambulatory Visit: Payer: Self-pay | Admitting: Family Medicine

## 2020-02-22 DIAGNOSIS — G4701 Insomnia due to medical condition: Secondary | ICD-10-CM

## 2020-03-15 ENCOUNTER — Telehealth: Payer: Self-pay

## 2020-03-15 DIAGNOSIS — I10 Essential (primary) hypertension: Secondary | ICD-10-CM

## 2020-03-15 DIAGNOSIS — Z125 Encounter for screening for malignant neoplasm of prostate: Secondary | ICD-10-CM

## 2020-03-15 DIAGNOSIS — E119 Type 2 diabetes mellitus without complications: Secondary | ICD-10-CM

## 2020-03-15 DIAGNOSIS — Z1159 Encounter for screening for other viral diseases: Secondary | ICD-10-CM

## 2020-03-15 NOTE — Addendum Note (Signed)
Addended by: Malva Limes on: 03/15/2020 04:21 PM   Modules accepted: Orders

## 2020-03-15 NOTE — Telephone Encounter (Signed)
Advised patient as below.  

## 2020-03-15 NOTE — Telephone Encounter (Signed)
Copied from CRM [id number]. Topic: General - Inquiry >> Mar 15, 2020 11:59 AM Preston Nguyen wrote: Reason for CRM: Patient would like his lab orders put in prior to his appointment he has to get back to work. He can be reached at [phone]. Please call him to let him know the orders have been placed, so he an go before his appointment tomorrow. Please advise

## 2020-03-15 NOTE — Telephone Encounter (Signed)
Future orders placed. He needs to be fasting.

## 2020-03-15 NOTE — Telephone Encounter (Signed)
Patient is wanting his labs done before his appt in the morning. Ok to order?

## 2020-03-16 ENCOUNTER — Encounter: Payer: Self-pay | Admitting: Family Medicine

## 2020-03-16 ENCOUNTER — Other Ambulatory Visit: Payer: Self-pay

## 2020-03-16 ENCOUNTER — Ambulatory Visit (INDEPENDENT_AMBULATORY_CARE_PROVIDER_SITE_OTHER): Payer: BC Managed Care – PPO | Admitting: Family Medicine

## 2020-03-16 VITALS — BP 128/82 | HR 81 | Temp 97.5°F | Ht 72.0 in | Wt 263.0 lb

## 2020-03-16 DIAGNOSIS — Z125 Encounter for screening for malignant neoplasm of prostate: Secondary | ICD-10-CM | POA: Diagnosis not present

## 2020-03-16 DIAGNOSIS — R351 Nocturia: Secondary | ICD-10-CM

## 2020-03-16 DIAGNOSIS — Z0000 Encounter for general adult medical examination without abnormal findings: Secondary | ICD-10-CM | POA: Diagnosis not present

## 2020-03-16 DIAGNOSIS — N529 Male erectile dysfunction, unspecified: Secondary | ICD-10-CM

## 2020-03-16 DIAGNOSIS — I10 Essential (primary) hypertension: Secondary | ICD-10-CM

## 2020-03-16 DIAGNOSIS — E119 Type 2 diabetes mellitus without complications: Secondary | ICD-10-CM

## 2020-03-16 DIAGNOSIS — Z1159 Encounter for screening for other viral diseases: Secondary | ICD-10-CM

## 2020-03-16 DIAGNOSIS — E669 Obesity, unspecified: Secondary | ICD-10-CM

## 2020-03-16 MED ORDER — TADALAFIL 5 MG PO TABS: 5.0000 mg | ORAL_TABLET | Freq: Every day | ORAL | 4 refills | Status: DC

## 2020-03-16 MED ORDER — TADALAFIL 5 MG PO TABS
5.0000 mg | ORAL_TABLET | Freq: Every day | ORAL | 4 refills | Status: DC
Start: 2020-03-16 — End: 2021-06-13

## 2020-03-16 NOTE — Progress Notes (Signed)
Complete physical exam   Patient: Preston Nguyen   DOB: 08/31/1970   49 y.o. Male  MRN: [id number] Visit Date: 03/16/2020  Today's healthcare provider: [name], MD   Chief Complaint  Patient presents with  . Annual Exam   Subjective    Preston Nguyen is a 50 y.o. male who presents today for a complete physical exam.  He reports consuming a general diet. Exercises regularly. He generally feels well. He reports sleeping well. He does have additional problems to discuss today. Has started going to spin classes.  HPI  He states he has had some trouble with ED over the last several months and inquires about ED medications. He has also had some urinary hesitancy and nocturia.   Past Medical History:  Diagnosis Date  . Acute stress disorder   . History of chicken pox   . Hypertension   . Insomnia   . Insomnia   . Seasonal allergic rhinitis    Past Surgical History:  Procedure Laterality Date  . HERNIA REPAIR  1970's   Inguinal hernia repair. "when he was younger"  . TEE WITHOUT CARDIOVERSION N/A 06/06/2013   Procedure: TRANSESOPHAGEAL ECHOCARDIOGRAM (TEE);  Surgeon: Quintella Reichert, MD;  Location: [facility] ENDOSCOPY;  Service: Cardiovascular;  Laterality: N/A;  . VASECTOMY  11/28/2013  . WISDOM TOOTH EXTRACTION  05/2013   Social History   Socioeconomic History  . Marital status: Married    Spouse name: [name]  . Number of children: 1  . Years of education: Masters  . Highest education level: Not on file  Occupational History  . Occupation: Pharmacist, hospital  Tobacco Use  . Smoking status: Never Smoker  . Smokeless tobacco: Never Used  Vaping Use  . Vaping Use: Never used  Substance and Sexual Activity  . Alcohol use: Yes    Alcohol/week: 0.0 standard drinks    Comment: occasionally  . Drug use: No  . Sexual activity: Not on file  Other Topics Concern  . Not on file  Social History Narrative   Reports significant stress at work.   Patient is  married [name]) and lives at home with his wife and his son.   Patient works full-time.   Patient has a Master's degree.   Patient is right-handed.   Patient drinks one cup of coffee daily and 3-4 sodas per day.   Social Determinants of Health   Financial Resource Strain: Not on file  Food Insecurity: Not on file  Transportation Needs: Not on file  Physical Activity: Not on file  Stress: Not on file  Social Connections: Not on file  Intimate Partner Violence: Not on file   Family Status  Relation Name Status  . Mother  Alive  . MGM  Deceased  . PGM  Deceased  . PGF  Deceased       myeloma  . Father  Deceased at age 66's       ACCIDENTAL  . Sister  Alive  . MGF  (Not Specified)  . Neg Hx  (Not Specified)   Family History  Problem Relation Age of Onset  . Coronary artery disease Mother        age 16s. had CABG  . Diabetes Mother   . Stroke Maternal Grandmother   . Diabetes Maternal Grandmother        type 2  . Hypertension Maternal Grandmother   . Diabetes Paternal Grandmother   . Breast cancer Paternal Grandmother   . Cancer Paternal  Grandfather   . Heart Problems Maternal Grandfather   . Colon cancer Neg Hx   . Prostate cancer Neg Hx    Allergies  Allergen Reactions  . Erythromycin     Possible cause of GI bleed    Patient Care Team: Malva Limes, MD as PCP - General (Family Medicine)   Medications: Outpatient Medications Prior to Visit  Medication Sig  . albuterol (VENTOLIN HFA) 108 (90 Base) MCG/ACT inhaler Inhale 2 puffs into the lungs every 6 (six) hours as needed for wheezing or shortness of breath.  Marland Kitchen aspirin 325 MG tablet Take 1 tablet (325 mg total) by mouth daily.  Marland Kitchen atorvastatin (LIPITOR) 40 MG tablet TAKE 1 TABLET BY MOUTH EVERY DAY  . fexofenadine (ALLEGRA) 180 MG tablet Take 180 mg by mouth daily as needed for allergies. Dose unkn  . fluticasone (FLONASE) 50 MCG/ACT nasal spray Place 2 sprays into both nostrils daily.  Marland Kitchen ibuprofen  (ADVIL,MOTRIN) 600 MG tablet as needed.  Marland Kitchen lisinopril-hydrochlorothiazide (ZESTORETIC) 10-12.5 MG tablet TAKE 1 TABLET BY MOUTH EVERY DAY  . metFORMIN (GLUCOPHAGE-XR) 500 MG 24 hr tablet Take 2 tablets (1,000 mg total) by mouth every evening.  . methocarbamol (ROBAXIN) 500 MG tablet TAKE 1 TABLET BY MOUTH 4 TIMES DAILY AS NEEDED  . Multiple Vitamin (MULTIVITAMIN WITH MINERALS) TABS tablet Take 1 tablet by mouth daily.  . pioglitazone (ACTOS) 45 MG tablet TAKE 1 TABLET BY MOUTH EVERY DAY  . venlafaxine XR (EFFEXOR-XR) 37.5 MG 24 hr capsule TAKE 1 CAPSULE BY MOUTH EVERY DAY  . zolpidem (AMBIEN) 10 MG tablet TAKE 1 TABLET (10 MG TOTAL) BY MOUTH AT BEDTIME AS NEEDED.   No facility-administered medications prior to visit.    Review of Systems  Constitutional: Negative.   HENT: Negative.   Eyes: Negative.   Respiratory: Negative.   Cardiovascular: Negative.   Gastrointestinal: Negative.   Endocrine: Negative.   Genitourinary: Negative.   Musculoskeletal: Negative.   Skin: Negative.   Allergic/Immunologic: Negative.   Neurological: Negative.   Hematological: Negative.   Psychiatric/Behavioral: Negative.      Objective    BP 128/82 (BP Location: Right Arm, Patient Position: Sitting, Cuff Size: Large)   Pulse 81   Temp (!) 97.5 F (36.4 C) (Temporal)   Ht 6' (1.829 m)   Wt 263 lb (119.3 kg)   BMI 35.67 kg/m    Physical Exam   General Appearance:    Obese male. Alert, cooperative, in no acute distress, appears stated age  Head:    Normocephalic, without obvious abnormality, atraumatic  Eyes:    PERRL, conjunctiva/corneas clear, EOM's intact, fundi    benign, both eyes       Ears:    Normal TM's and external ear canals, both ears  Nose:   Nares normal, septum midline, mucosa normal, no drainage   or sinus tenderness  Throat:   Lips, mucosa, and tongue normal; teeth and gums normal  Neck:   Supple, symmetrical, trachea midline, no adenopathy;       thyroid:  No  enlargement/tenderness/nodules; no carotid   bruit or JVD  Back:     Symmetric, no curvature, ROM normal, no CVA tenderness  Lungs:     Clear to auscultation bilaterally, respirations unlabored  Chest wall:    No tenderness or deformity  Heart:    Normal heart rate. Normal rhythm. No murmurs, rubs, or gallops.  S1 and S2 normal  Abdomen:     Soft, non-tender, bowel sounds active all four  quadrants,    no masses, no organomegaly  Genitalia:    deferred  Rectal:    deferred  Extremities:   All extremities are intact. No cyanosis or edema  Pulses:   2+ and symmetric all extremities  Skin:   Skin color, texture, turgor normal, no rashes or lesions  Lymph nodes:   Cervical, supraclavicular, and axillary nodes normal  Neurologic:   CNII-XII intact. Normal strength, sensation and reflexes      throughout     Last depression screening scores PHQ 2/9 Scores 11/12/2019 06/10/2019 10/26/2017  PHQ - 2 Score 0 0 0  PHQ- 9 Score - - 0   Last fall risk screening Fall Risk  11/12/2019  Falls in the past year? 0  Number falls in past yr: 0  Injury with Fall? 0  Follow up Falls evaluation completed   Last Audit-C alcohol use screening Alcohol Use Disorder Test (AUDIT) 11/12/2019  1. How often do you have a drink containing alcohol? 2  2. How many drinks containing alcohol do you have on a typical day when you are drinking? 0  3. How often do you have six or more drinks on one occasion? 0  AUDIT-C Score 2  Alcohol Brief Interventions/Follow-up AUDIT Score <7 follow-up not indicated   A score of 3 or more in women, and 4 or more in men indicates increased risk for alcohol abuse, EXCEPT if all of the points are from question 1   No results found for any visits on 03/16/20.  Assessment & Plan    Routine Health Maintenance and Physical Exam  Exercise Activities and Dietary recommendations Goals   None     Immunization History  Administered Date(s) Administered  . Influenza,inj,Quad PF,6+ Mos  10/26/2017, 11/23/2018, 11/12/2019  . PFIZER(Purple Top)SARS-COV-2 Vaccination 05/22/2019, 06/17/2019  . Pneumococcal Polysaccharide-23 10/26/2017  . Td 10/26/2017  . Tdap 05/29/2006    Health Maintenance  Topic Date Due  . Hepatitis C Screening  Never done  . FOOT EXAM  Never done  . COLONOSCOPY (Pts 45-54yrs Insurance coverage will need to be confirmed)  Never done  . COVID-19 Vaccine (3 - Booster for Pfizer series) 12/18/2019  . HEMOGLOBIN A1C  05/11/2020  . OPHTHALMOLOGY EXAM  02/09/2021  . TETANUS/TDAP  10/27/2027  . INFLUENZA VACCINE  Completed  . PNEUMOCOCCAL POLYSACCHARIDE VACCINE AGE 27-64 HIGH RISK  Completed  . HIV Screening  Completed    Discussed health benefits of physical activity, and encouraged him to engage in regular exercise appropriate for his age and condition.  1. Annual physical exam Recommended and given information regarding CRC screening. He wants to check his insurance before proceeding.  - EKG 12-Lead  2. Essential hypertension, benign Well controlled.  Continue current medications.   - EKG 12-Lead  3. Type 2 diabetes mellitus without complication, without long-term current use of insulin (HCC) Labs drawn prior to o.v. and pending.  - EKG 12-Lead  4. Erectile dysfunction, unspecified erectile dysfunction type try- tadalafil (CIALIS) 5 MG tablet; Take 1 tablet (5 mg total) by mouth daily.  Dispense: 90 tablet; Refill: 4  5. Nocturia  - tadalafil (CIALIS) 5 MG tablet; Take 1 tablet (5 mg total) by mouth daily.  Dispense: 90 tablet; Refill: 4  6. Obesity (BMI 30-39.9) Has recently started spin class which he has previously found to help him manage his weight.    No follow-ups on file.     The entirety of the information documented in the History of Present Illness,  Review of Systems and Physical Exam were personally obtained by me. Portions of this information were initially documented by the CMA and reviewed by me for thoroughness and  accuracy.      [name], MD  [facility] [phone] (phone) [phone] (fax)  [facility]

## 2020-03-16 NOTE — Patient Instructions (Addendum)
.   You are due for screening for colon polyps to prevent advanced colon cancer. Check with your insurance to find out what the out of pocket cost is for the stool Cologuard test and a screening colonoscopy  . You can install the GoodRx app on your smart phone to find the lowest prices for generic medications.

## 2020-03-17 ENCOUNTER — Other Ambulatory Visit: Payer: Self-pay | Admitting: Family Medicine

## 2020-03-17 LAB — LIPID PANEL
Chol/HDL Ratio: 3 ratio (ref 0.0–5.0)
Cholesterol, Total: 120 mg/dL (ref 100–199)
HDL: 40 mg/dL (ref >39)
LDL Chol Calc (NIH): 60 mg/dL (ref 0–99)
Triglycerides: 111 mg/dL (ref 0–149)
VLDL Cholesterol Cal: 20 mg/dL (ref 5–40)

## 2020-03-17 LAB — HEMOGLOBIN A1C
Est. average glucose Bld gHb Est-mCnc: 252 mg/dL
Hgb A1c MFr Bld: 10.4 % — ABNORMAL HIGH (ref 4.8–5.6)

## 2020-03-17 LAB — COMPREHENSIVE METABOLIC PANEL
ALT: 41 IU/L (ref 0–44)
AST: 24 IU/L (ref 0–40)
Albumin/Globulin Ratio: 2.2 (ref 1.2–2.2)
Albumin: 4.6 g/dL (ref 4.0–5.0)
Alkaline Phosphatase: 67 IU/L (ref 44–121)
BUN/Creatinine Ratio: 21 — ABNORMAL HIGH (ref 9–20)
BUN: 22 mg/dL (ref 6–24)
Bilirubin Total: 0.5 mg/dL (ref 0.0–1.2)
CO2: 23 mmol/L (ref 20–29)
Calcium: 9.3 mg/dL (ref 8.7–10.2)
Chloride: 98 mmol/L (ref 96–106)
Creatinine, Ser: 1.07 mg/dL (ref 0.76–1.27)
GFR calc Af Amer: 93 mL/min/{1.73_m2} (ref >59)
GFR calc non Af Amer: 81 mL/min/{1.73_m2} (ref >59)
Globulin, Total: 2.1 g/dL (ref 1.5–4.5)
Glucose: 197 mg/dL — ABNORMAL HIGH (ref 65–99)
Potassium: 4.4 mmol/L (ref 3.5–5.2)
Sodium: 136 mmol/L (ref 134–144)
Total Protein: 6.7 g/dL (ref 6.0–8.5)

## 2020-03-17 LAB — CBC
Hematocrit: 47.3 % (ref 37.5–51.0)
Hemoglobin: 15.6 g/dL (ref 13.0–17.7)
MCH: 29.7 pg (ref 26.6–33.0)
MCHC: 33 g/dL (ref 31.5–35.7)
MCV: 90 fL (ref 79–97)
Platelets: 280 10*3/uL (ref 150–450)
RBC: 5.26 x10E6/uL (ref 4.14–5.80)
RDW: 12.8 % (ref 11.6–15.4)
WBC: 7 10*3/uL (ref 3.4–10.8)

## 2020-03-17 LAB — HEPATITIS C ANTIBODY: Hep C Virus Ab: 0.1 s/co ratio (ref 0.0–0.9)

## 2020-03-17 LAB — PSA: Prostate Specific Ag, Serum: 1.2 ng/mL (ref 0.0–4.0)

## 2020-03-25 ENCOUNTER — Other Ambulatory Visit: Payer: Self-pay

## 2020-03-25 DIAGNOSIS — E119 Type 2 diabetes mellitus without complications: Secondary | ICD-10-CM

## 2020-03-25 MED ORDER — OZEMPIC (0.25 OR 0.5 MG/DOSE) 2 MG/1.5ML ~~LOC~~ SOPN: 0.2500 mg | PEN_INJECTOR | SUBCUTANEOUS | 1 refills | Status: DC

## 2020-04-29 ENCOUNTER — Other Ambulatory Visit: Payer: Self-pay | Admitting: Family Medicine

## 2020-04-29 DIAGNOSIS — E119 Type 2 diabetes mellitus without complications: Secondary | ICD-10-CM

## 2020-04-29 DIAGNOSIS — F430 Acute stress reaction: Secondary | ICD-10-CM

## 2020-04-29 DIAGNOSIS — I10 Essential (primary) hypertension: Secondary | ICD-10-CM

## 2020-04-29 DIAGNOSIS — Z8673 Personal history of transient ischemic attack (TIA), and cerebral infarction without residual deficits: Secondary | ICD-10-CM

## 2020-04-29 MED ORDER — VENLAFAXINE HCL ER 37.5 MG PO CP24: 37.5000 mg | ORAL_CAPSULE | Freq: Every day | ORAL | 4 refills | Status: DC

## 2020-04-29 MED ORDER — PIOGLITAZONE HCL 45 MG PO TABS: 45.0000 mg | ORAL_TABLET | Freq: Every day | ORAL | 4 refills | Status: DC

## 2020-04-29 MED ORDER — ATORVASTATIN CALCIUM 40 MG PO TABS: 40.0000 mg | ORAL_TABLET | Freq: Every day | ORAL | 4 refills | Status: DC

## 2020-04-29 MED ORDER — LISINOPRIL-HYDROCHLOROTHIAZIDE 10-12.5 MG PO TABS: 1.0000 | ORAL_TABLET | Freq: Every day | ORAL | 4 refills | Status: DC

## 2020-05-20 ENCOUNTER — Other Ambulatory Visit: Payer: Self-pay | Admitting: Family Medicine

## 2020-05-20 DIAGNOSIS — E119 Type 2 diabetes mellitus without complications: Secondary | ICD-10-CM

## 2020-06-05 ENCOUNTER — Other Ambulatory Visit: Payer: Self-pay | Admitting: Family Medicine

## 2020-06-05 DIAGNOSIS — M542 Cervicalgia: Secondary | ICD-10-CM

## 2020-06-05 DIAGNOSIS — G8929 Other chronic pain: Secondary | ICD-10-CM

## 2020-06-05 NOTE — Telephone Encounter (Signed)
Requested medication (s) are due for refill today: yes  Requested medication (s) are on the active medication list: yes  Last refill:  06/10/19  Future visit scheduled: yes  Notes to clinic:  med not delegated to NT to RF   Requested Prescriptions  Pending Prescriptions Disp Refills   methocarbamol (ROBAXIN) 500 MG tablet [Pharmacy Med Name: METHOCARBAMOL 500 MG TABLET] 90 tablet 2    Sig: TAKE 1 TABLET BY MOUTH 4 TIMES DAILY AS NEEDED      Not Delegated - Analgesics:  Muscle Relaxants Failed - 06/05/2020 10:36 AM      Failed - This refill cannot be delegated      Passed - Valid encounter within last 6 months    Recent Outpatient Visits           2 months ago Annual physical exam   [facility] Malva Limes, MD   4 months ago COVID-19   [facility] Lannon, Harvey, PA-C   6 months ago Type 2 diabetes mellitus without complication, without long-term current use of insulin [facility])   [facility] Malva Limes, MD   12 months ago Chronic neck pain   [facility] Malva Limes, MD   1 year ago Type 2 diabetes mellitus without complication, without long-term current use of insulin [facility])   [facility] Malva Limes, MD       Future Appointments             In 3 days Fisher, Demetrios Isaacs, MD [facility], [facility]

## 2020-06-07 NOTE — Progress Notes (Signed)
Established patient visit   Patient: Preston Nguyen   DOB: [birth year]   49 y.o. Male  MRN: [id number] Visit Date: 06/08/2020  Today's healthcare provider: [name], MD   Chief Complaint  Patient presents with  . Diabetes   Subjective    HPI  Diabetes Mellitus Type II, Follow-up  Lab Results  Component Value Date   HGBA1C 7.2 (A) 06/08/2020   HGBA1C 10.4 (H) 03/16/2020   HGBA1C 9.0 (A) 11/12/2019   Wt Readings from Last 3 Encounters:  06/08/20 258 lb (117 kg)  03/16/20 263 lb (119.3 kg)  11/12/19 259 lb (117.5 kg)   Last seen for diabetes on 03/16/2020.  Management since then includes starting Ozempic 0.25mg  once a week x 4, then increase to 0.5mg  once a week. Patient was to follow up in 2 months to recheck A1C. He reports good compliance with treatment. He is not having side effects.  Symptoms: No fatigue No foot ulcerations  No appetite changes No nausea  No paresthesia of the feet  No polydipsia  No polyuria No visual disturbances   No vomiting     Home blood sugar records: postprandial range: 120's  Episodes of hypoglycemia? No    Current insulin regiment: none Most Recent Eye Exam: 02/10/2020 Current exercise: cardiovascular workout on exercise equipment Current diet habits: in general, an "unhealthy" diet  Pertinent Labs: Lab Results  Component Value Date   CHOL 120 03/16/2020   HDL 40 03/16/2020   LDLCALC 60 03/16/2020   TRIG 111 03/16/2020   CHOLHDL 3.0 03/16/2020   Lab Results  Component Value Date   NA 136 03/16/2020   K 4.4 03/16/2020   CREATININE 1.07 03/16/2020   GFRNONAA 81 03/16/2020   GFRAA 93 03/16/2020   GLUCOSE 197 (H) 03/16/2020     ---------------------------------------------------------------------------------------------------     Medications: Outpatient Medications Prior to Visit  Medication Sig  . albuterol (VENTOLIN HFA) 108 (90 Base) MCG/ACT inhaler Inhale 2 puffs into the lungs every 6 (six) hours  as needed for wheezing or shortness of breath.  Marland Kitchen aspirin 325 MG tablet Take 1 tablet (325 mg total) by mouth daily.  Marland Kitchen atorvastatin (LIPITOR) 40 MG tablet Take 1 tablet (40 mg total) by mouth daily.  . fexofenadine (ALLEGRA) 180 MG tablet Take 180 mg by mouth daily as needed for allergies. Dose unkn  . fluticasone (FLONASE) 50 MCG/ACT nasal spray Place 2 sprays into both nostrils daily.  Marland Kitchen ibuprofen (ADVIL,MOTRIN) 600 MG tablet as needed.  Marland Kitchen lisinopril-hydrochlorothiazide (ZESTORETIC) 10-12.5 MG tablet Take 1 tablet by mouth daily.  . metFORMIN (GLUCOPHAGE-XR) 500 MG 24 hr tablet Take 2 tablets (1,000 mg total) by mouth every evening.  . methocarbamol (ROBAXIN) 500 MG tablet TAKE 1 TABLET BY MOUTH 4 TIMES DAILY AS NEEDED  . Multiple Vitamin (MULTIVITAMIN WITH MINERALS) TABS tablet Take 1 tablet by mouth daily.  Marland Kitchen OZEMPIC, 0.25 OR 0.5 MG/DOSE, 2 MG/1.5ML SOPN INJECT 0.25 MG INTO THE SKIN ONCE A WEEK. FOR 4 WEEKS, THEN INJECT 0.5 MG INTO THE SKIN ONCE A WEEK AND CONTINUE AT THIS DOSE.  . pioglitazone (ACTOS) 45 MG tablet Take 1 tablet (45 mg total) by mouth daily.  . tadalafil (CIALIS) 5 MG tablet Take 1 tablet (5 mg total) by mouth daily.  Marland Kitchen venlafaxine XR (EFFEXOR-XR) 37.5 MG 24 hr capsule Take 1 capsule (37.5 mg total) by mouth daily.  Marland Kitchen zolpidem (AMBIEN) 10 MG tablet TAKE 1 TABLET (10 MG TOTAL) BY MOUTH AT BEDTIME AS  NEEDED.   No facility-administered medications prior to visit.    Review of Systems  Constitutional: Negative for appetite change, chills and fever.  Respiratory: Negative for chest tightness, shortness of breath and wheezing.   Cardiovascular: Negative for chest pain and palpitations.  Gastrointestinal: Negative for abdominal pain, nausea and vomiting.       Objective    BP 105/72 (BP Location: Right Arm, Patient Position: Sitting, Cuff Size: Large)   Pulse 89   Temp 97.9 F (36.6 C) (Temporal)   Resp 16   Wt 258 lb (117 kg)   BMI 34.99 kg/m     Physical Exam   General appearance: Obese male, cooperative and in no acute distress Head: Normocephalic, without obvious abnormality, atraumatic Respiratory: Respirations even and unlabored, normal respiratory rate Extremities: All extremities are intact.  Skin: Skin color, texture, turgor normal. No rashes seen  Psych: Appropriate mood and affect. Neurologic: Mental status: Alert, oriented to person, place, and time, thought content appropriate.   Results for orders placed or performed in visit on 06/08/20  POCT HgB A1C  Result Value Ref Range   Hemoglobin A1C 7.2 (A) 4.0 - 5.6 %   Est. average glucose Bld gHb Est-mCnc 160     Assessment & Plan     1. Type 2 diabetes mellitus without complication, without long-term current use of insulin (HCC) Doing very well since starting Ozempic in February. Tolerating titration up to 0.5mg  weekly. Continue 0.5 for now. Continue metformin 1,000 daily for now. Will reduce pioglitazone to 30mg  with next refill in June.    Future Appointments  Date Time Provider Department Center  10/26/2020  8:00 AM 10/28/2020, Sherrie Mustache, MD [facility]         The entirety of the information documented in the History of Present Illness, Review of Systems and Physical Exam were personally obtained by me. Portions of this information were initially documented by the CMA and reviewed by me for thoroughness and accuracy.      Demetrios Isaacs, MD  [facility] [phone] (phone) [phone] (fax)  [facility]

## 2020-06-08 ENCOUNTER — Encounter: Payer: Self-pay | Admitting: Family Medicine

## 2020-06-08 ENCOUNTER — Ambulatory Visit (INDEPENDENT_AMBULATORY_CARE_PROVIDER_SITE_OTHER): Payer: BC Managed Care – PPO | Admitting: Family Medicine

## 2020-06-08 ENCOUNTER — Other Ambulatory Visit: Payer: Self-pay

## 2020-06-08 VITALS — BP 105/72 | HR 89 | Temp 97.9°F | Resp 16 | Wt 258.0 lb

## 2020-06-08 DIAGNOSIS — E119 Type 2 diabetes mellitus without complications: Secondary | ICD-10-CM | POA: Diagnosis not present

## 2020-06-08 LAB — POCT GLYCOSYLATED HEMOGLOBIN (HGB A1C)
Est. average glucose Bld gHb Est-mCnc: 160
Hemoglobin A1C: 7.2 % — AB (ref 4.0–5.6)

## 2020-06-08 NOTE — Patient Instructions (Signed)
.   We'll reduce the dose of pioglitazone to 30mg  with your next refill in June

## 2020-07-28 ENCOUNTER — Other Ambulatory Visit: Payer: Self-pay | Admitting: Family Medicine

## 2020-07-28 DIAGNOSIS — E119 Type 2 diabetes mellitus without complications: Secondary | ICD-10-CM

## 2020-07-28 MED ORDER — PIOGLITAZONE HCL 30 MG PO TABS: 45.0000 mg | ORAL_TABLET | Freq: Every day | ORAL | 3 refills | Status: DC

## 2020-07-28 NOTE — Progress Notes (Signed)
Reduce pioglitazone to 30 as per previously o.v. notes.

## 2020-08-16 ENCOUNTER — Other Ambulatory Visit: Payer: Self-pay | Admitting: Family Medicine

## 2020-08-16 DIAGNOSIS — G8929 Other chronic pain: Secondary | ICD-10-CM

## 2020-08-16 DIAGNOSIS — M542 Cervicalgia: Secondary | ICD-10-CM

## 2020-08-17 NOTE — Telephone Encounter (Signed)
Requested medication (s) are due for refill today: Yes  Requested medication (s) are on the active medication list: Yes  Last refill:  06/07/20  Future visit scheduled: Yes  Notes to clinic:  See request.    Requested Prescriptions  Pending Prescriptions Disp Refills   methocarbamol (ROBAXIN) 500 MG tablet [Pharmacy Med Name: METHOCARBAMOL 500 MG TABLET] 90 tablet 2    Sig: TAKE 1 TABLET BY MOUTH 4 TIMES DAILY AS NEEDED      Not Delegated - Analgesics:  Muscle Relaxants Failed - 08/16/2020 10:31 AM      Failed - This refill cannot be delegated      Passed - Valid encounter within last 6 months    Recent Outpatient Visits           2 months ago Type 2 diabetes mellitus without complication, without long-term current use of insulin [facility])   [facility] Malva Limes, MD   5 months ago Annual physical exam   [facility] Malva Limes, MD   6 months ago COVID-19   [facility] [place], [place], [place]   9 months ago Type 2 diabetes mellitus without complication, without long-term current use of insulin [facility])   [facility] Malva Limes, MD   1 year ago Chronic neck pain   [facility] Malva Limes, MD       Future Appointments             In 2 months Fisher, Demetrios Isaacs, MD [facility], [facility]

## 2020-08-29 ENCOUNTER — Other Ambulatory Visit: Payer: Self-pay | Admitting: Family Medicine

## 2020-08-29 DIAGNOSIS — G4701 Insomnia due to medical condition: Secondary | ICD-10-CM

## 2020-08-29 NOTE — Telephone Encounter (Signed)
Requested medication (s) are due for refill today: yes  Requested medication (s) are on the active medication list: yes  Last refill:  02/23/20  Future visit scheduled: yes  Notes to clinic:  med not delegated to NT to RF   Requested Prescriptions  Pending Prescriptions Disp Refills   zolpidem (AMBIEN) 10 MG tablet [Pharmacy Med Name: ZOLPIDEM TARTRATE 10 MG TABLET] 90 tablet     Sig: TAKE 1 TABLET BY MOUTH AT BEDTIME AS NEEDED.      Not Delegated - Psychiatry:  Anxiolytics/Hypnotics Failed - 08/29/2020 10:30 AM      Failed - This refill cannot be delegated      Failed - Urine Drug Screen completed in last 360 days      Passed - Valid encounter within last 6 months    Recent Outpatient Visits           2 months ago Type 2 diabetes mellitus without complication, without long-term current use of insulin [facility])   [facility] Malva Limes, MD   5 months ago Annual physical exam   [facility] Malva Limes, MD   7 months ago COVID-19   [facility] [place], [place]   9 months ago Type 2 diabetes mellitus without complication, without long-term current use of insulin [facility])   [facility] Malva Limes, MD   1 year ago Chronic neck pain   [facility] Malva Limes, MD       Future Appointments             In 1 month Fisher, Demetrios Isaacs, MD [facility], [facility]

## 2020-10-26 ENCOUNTER — Other Ambulatory Visit: Payer: Self-pay

## 2020-10-26 ENCOUNTER — Encounter: Payer: Self-pay | Admitting: Family Medicine

## 2020-10-26 ENCOUNTER — Ambulatory Visit: Payer: BC Managed Care – PPO | Admitting: Family Medicine

## 2020-10-26 VITALS — BP 123/69 | HR 91 | Wt 257.0 lb

## 2020-10-26 DIAGNOSIS — I10 Essential (primary) hypertension: Secondary | ICD-10-CM | POA: Diagnosis not present

## 2020-10-26 DIAGNOSIS — Z23 Encounter for immunization: Secondary | ICD-10-CM | POA: Diagnosis not present

## 2020-10-26 DIAGNOSIS — E119 Type 2 diabetes mellitus without complications: Secondary | ICD-10-CM | POA: Diagnosis not present

## 2020-10-26 NOTE — Progress Notes (Signed)
Established patient visit   Patient: Preston Nguyen   DOB: 1970-10-06   50 y.o. Male  MRN: [id number] Visit Date: 10/26/2020  Today's healthcare provider: [name], MD   Chief Complaint  Patient presents with   Hypertension   Diabetes   Subjective    HPI  Diabetes Mellitus Type II, Follow-up  Lab Results  Component Value Date   HGBA1C 7.2 (A) 06/08/2020   HGBA1C 10.4 (H) 03/16/2020   HGBA1C 9.0 (A) 11/12/2019   Wt Readings from Last 3 Encounters:  10/26/20 257 lb (116.6 kg)  06/08/20 258 lb (117 kg)  03/16/20 263 lb (119.3 kg)   Last seen for diabetes 5 months ago.  Management since then includes no changes but consider reducing Actos in the future. He reports excellent compliance with treatment. He is not having side effects.  Symptoms: No fatigue No foot ulcerations  No appetite changes No nausea  No paresthesia of the feet  No polydipsia  No polyuria No visual disturbances   No vomiting     Home blood sugar records:  are not being checked  Episodes of hypoglycemia? No    Current insulin regiment: Ozempic Most Recent Eye Exam: 02/10/2020  Current diet habits: in general, a "healthy" diet    Pertinent Labs: Lab Results  Component Value Date   CHOL 120 03/16/2020   HDL 40 03/16/2020   LDLCALC 60 03/16/2020   TRIG 111 03/16/2020   CHOLHDL 3.0 03/16/2020   Lab Results  Component Value Date   NA 136 03/16/2020   K 4.4 03/16/2020   CREATININE 1.07 03/16/2020   GFRNONAA 81 03/16/2020   GFRAA 93 03/16/2020   GLUCOSE 197 (H) 03/16/2020     ---------------------------------------------------------------------------------------------------     Medications: Outpatient Medications Prior to Visit  Medication Sig   albuterol (VENTOLIN HFA) 108 (90 Base) MCG/ACT inhaler Inhale 2 puffs into the lungs every 6 (six) hours as needed for wheezing or shortness of breath.   aspirin 325 MG tablet Take 1 tablet (325 mg total) by mouth daily.    atorvastatin (LIPITOR) 40 MG tablet Take 1 tablet (40 mg total) by mouth daily.   fexofenadine (ALLEGRA) 180 MG tablet Take 180 mg by mouth daily as needed for allergies. Dose unkn   fluticasone (FLONASE) 50 MCG/ACT nasal spray Place 2 sprays into both nostrils daily.   ibuprofen (ADVIL,MOTRIN) 600 MG tablet as needed.   lisinopril-hydrochlorothiazide (ZESTORETIC) 10-12.5 MG tablet Take 1 tablet by mouth daily.   metFORMIN (GLUCOPHAGE-XR) 500 MG 24 hr tablet Take 2 tablets (1,000 mg total) by mouth every evening.   methocarbamol (ROBAXIN) 500 MG tablet TAKE 1 TABLET BY MOUTH 4 TIMES DAILY AS NEEDED.   Multiple Vitamin (MULTIVITAMIN WITH MINERALS) TABS tablet Take 1 tablet by mouth daily.   OZEMPIC, 0.25 OR 0.5 MG/DOSE, 2 MG/1.5ML SOPN INJECT 0.25 MG INTO THE SKIN ONCE A WEEK. FOR 4 WEEKS, THEN INJECT 0.5 MG INTO THE SKIN ONCE A WEEK AND CONTINUE AT THIS DOSE.   pioglitazone (ACTOS) 30 MG tablet Take 1.5 tablets (45 mg total) by mouth daily.   tadalafil (CIALIS) 5 MG tablet Take 1 tablet (5 mg total) by mouth daily.   venlafaxine XR (EFFEXOR-XR) 37.5 MG 24 hr capsule Take 1 capsule (37.5 mg total) by mouth daily.   zolpidem (AMBIEN) 10 MG tablet TAKE 1 TABLET BY MOUTH AT BEDTIME AS NEEDED.   No facility-administered medications prior to visit.    Review of Systems  Constitutional: Negative.  Respiratory: Negative.    Cardiovascular: Negative.   Gastrointestinal: Negative.   Endocrine: Negative.   Skin:  Negative for wound.  Neurological:  Negative for dizziness, light-headedness and headaches.      Objective    BP 123/69 (BP Location: Right Arm, Patient Position: Sitting, Cuff Size: Large)   Pulse 91   Wt 257 lb (116.6 kg)   SpO2 100%   BMI 34.86 kg/m    Physical Exam  General appearance: Obese male, cooperative and in no acute distress Head: Normocephalic, without obvious abnormality, atraumatic Respiratory: Respirations even and unlabored, normal respiratory  rate Extremities: All extremities are intact.  Skin: Skin color, texture, turgor normal. No rashes seen  Psych: Appropriate mood and affect. Neurologic: Mental status: Alert, oriented to person, place, and time, thought content appropriate.     Assessment & Plan     1. Essential hypertension, benign Well controlled.  Continue current medications.    2. Type 2 diabetes mellitus without complication, without long-term current use of insulin (HCC) Doing well with 0.5 Ozempic. Is willing to increase dose if A1c is not in ideal range.  - Hemoglobin A1c  3. Need for influenza vaccination  - Flu Vaccine QUAD 36+ mos IM (Fluarix/Fluzone)   Counseled and recommended colon cancer screening        The entirety of the information documented in the History of Present Illness, Review of Systems and Physical Exam were personally obtained by me. Portions of this information were initially documented by the CMA and reviewed by me for thoroughness and accuracy.     [name], MD  [facility] [phone] (phone) [phone] (fax)  [facility]

## 2020-10-26 NOTE — Patient Instructions (Signed)
Please review the attached list of medications and notify my office if there are any errors.   The current recommendation is to start colon cancer screening with a colonoscopy or Cologuard test at age 50. I suggest checking with insurance to see if this screening test is covered.   

## 2020-10-27 LAB — HEMOGLOBIN A1C
Est. average glucose Bld gHb Est-mCnc: 134 mg/dL
Hgb A1c MFr Bld: 6.3 % — ABNORMAL HIGH (ref 4.8–5.6)

## 2020-12-12 ENCOUNTER — Other Ambulatory Visit: Payer: Self-pay | Admitting: Family Medicine

## 2020-12-12 DIAGNOSIS — G4701 Insomnia due to medical condition: Secondary | ICD-10-CM

## 2020-12-13 NOTE — Telephone Encounter (Signed)
Requested medication (s) are due for refill today:   Provider to review  Requested medication (s) are on the active medication list:   Yes  Future visit scheduled:   No   Last ordered: 08/30/2020 #90, 0 refills  Non delegated refill per protocol   Requested Prescriptions  Pending Prescriptions Disp Refills   zolpidem (AMBIEN) 10 MG tablet [Pharmacy Med Name: ZOLPIDEM TARTRATE 10 MG TABLET] 90 tablet 0    Sig: TAKE 1 TABLET BY MOUTH EVERY DAY AT BEDTIME AS NEEDED     Not Delegated - Psychiatry:  Anxiolytics/Hypnotics Failed - 12/12/2020  7:46 PM      Failed - This refill cannot be delegated      Failed - Urine Drug Screen completed in last 360 days      Passed - Valid encounter within last 6 months    Recent Outpatient Visits           1 month ago Essential hypertension, benign   [facility] Malva Limes, MD   6 months ago Type 2 diabetes mellitus without complication, without long-term current use of insulin [facility])   [facility] Malva Limes, MD   9 months ago Annual physical exam   [facility] Malva Limes, MD   10 months ago COVID-19   [facility] [place], [place], [place]   1 year ago Type 2 diabetes mellitus without complication, without long-term current use of insulin [facility])   [facility] Sherrie Mustache, Demetrios Isaacs, MD

## 2021-02-02 ENCOUNTER — Other Ambulatory Visit: Payer: Self-pay | Admitting: Family Medicine

## 2021-02-02 DIAGNOSIS — E119 Type 2 diabetes mellitus without complications: Secondary | ICD-10-CM

## 2021-02-02 NOTE — Telephone Encounter (Signed)
Requested Prescriptions  Pending Prescriptions Disp Refills   metFORMIN (GLUCOPHAGE-XR) 500 MG 24 hr tablet [Pharmacy Med Name: METFORMIN HCL ER 500 MG TABLET] 180 tablet 4    Sig: TAKE 2 TABLETS (1,000 MG TOTAL) BY MOUTH EVERY EVENING.     Endocrinology:  Diabetes - Biguanides Passed - 02/02/2021  1:13 AM      Passed - Cr in normal range and within 360 days    Creatinine  Date Value Ref Range Status  06/04/2013 1.17 0.60 - 1.30 mg/dL Final   Creatinine, Ser  Date Value Ref Range Status  03/16/2020 1.07 0.76 - 1.27 mg/dL Final         Passed - HBA1C is between 0 and 7.9 and within 180 days    Hemoglobin A1C  Date Value Ref Range Status  09/19/2016 6.0  Final   Hgb A1c MFr Bld  Date Value Ref Range Status  10/26/2020 6.3 (H) 4.8 - 5.6 % Final    Comment:             Prediabetes: 5.7 - 6.4          Diabetes: >6.4          Glycemic control for adults with diabetes: <7.0          Passed - eGFR in normal range and within 360 days    EGFR (African American)  Date Value Ref Range Status  06/04/2013 >60  Final   GFR calc Af Amer  Date Value Ref Range Status  03/16/2020 93 >59 mL/min/1.73 Final    Comment:    **In accordance with recommendations from the NKF-ASN Task force,**   [facility] is in the process of updating its eGFR calculation to the   2021 CKD-EPI creatinine equation that estimates kidney function   without a race variable.    EGFR (Non-African Amer.)  Date Value Ref Range Status  06/04/2013 >60  Final    Comment:    eGFR values <45m/min/1.73 m2 may be an indication of chronic kidney disease (CKD). Calculated eGFR is useful in patients with stable renal function. The eGFR calculation will not be reliable in acutely ill patients when serum creatinine is changing rapidly. It is not useful in  patients on dialysis. The eGFR calculation may not be applicable to patients at the low and high extremes of body sizes, pregnant women, and vegetarians.    GFR calc  non Af Amer  Date Value Ref Range Status  03/16/2020 81 >59 mL/min/1.73 Final         Passed - Valid encounter within last 6 months    Recent Outpatient Visits          3 months ago Essential hypertension, benign   [facility]   7 months ago Type 2 diabetes mellitus without complication, without long-term current use of insulin ([facility]   [facility]   10 months ago Annual physical exam   [facility]   [place]  1 year ago Type 2 diabetes mellitus without complication, without long-term current use of insulin ([facility]   [facility]

## 2021-02-05 ENCOUNTER — Other Ambulatory Visit: Payer: Self-pay | Admitting: Family Medicine

## 2021-02-05 DIAGNOSIS — G8929 Other chronic pain: Secondary | ICD-10-CM

## 2021-02-05 NOTE — Telephone Encounter (Signed)
Requested medication (s) are due for refill today: yes  Requested medication (s) are on the active medication list: yes  Last refill:  08/17/20 #90 2 RF  Future visit scheduled: no  Notes to clinic:  med not delegated to NT to RF   Requested Prescriptions  Pending Prescriptions Disp Refills   methocarbamol (ROBAXIN) 500 MG tablet [Pharmacy Med Name: METHOCARBAMOL 500 MG TABLET] 90 tablet 2    Sig: TAKE 1 TABLET BY MOUTH 4 TIMES DAILY AS NEEDED.     Not Delegated - Analgesics:  Muscle Relaxants Failed - 02/05/2021  9:57 AM      Failed - This refill cannot be delegated      Passed - Valid encounter within last 6 months    Recent Outpatient Visits           3 months ago Essential hypertension, benign   [facility] Malva Limes, MD   8 months ago Type 2 diabetes mellitus without complication, without long-term current use of insulin [facility])   [facility] Malva Limes, MD   10 months ago Annual physical exam   [facility] Malva Limes, MD   1 year ago COVID-19   [facility] [place], [place], [place]   1 year ago Type 2 diabetes mellitus without complication, without long-term current use of insulin [facility])   [facility] Malva Limes, MD

## 2021-03-20 ENCOUNTER — Other Ambulatory Visit: Payer: Self-pay | Admitting: Family Medicine

## 2021-03-20 DIAGNOSIS — E119 Type 2 diabetes mellitus without complications: Secondary | ICD-10-CM

## 2021-04-29 ENCOUNTER — Other Ambulatory Visit: Payer: Self-pay | Admitting: Family Medicine

## 2021-04-29 DIAGNOSIS — E119 Type 2 diabetes mellitus without complications: Secondary | ICD-10-CM

## 2021-05-04 ENCOUNTER — Other Ambulatory Visit: Payer: Self-pay | Admitting: Family Medicine

## 2021-05-04 DIAGNOSIS — I10 Essential (primary) hypertension: Secondary | ICD-10-CM

## 2021-05-04 DIAGNOSIS — F430 Acute stress reaction: Secondary | ICD-10-CM

## 2021-05-29 ENCOUNTER — Other Ambulatory Visit: Payer: Self-pay | Admitting: Family Medicine

## 2021-05-29 DIAGNOSIS — E119 Type 2 diabetes mellitus without complications: Secondary | ICD-10-CM

## 2021-05-29 DIAGNOSIS — I10 Essential (primary) hypertension: Secondary | ICD-10-CM

## 2021-05-29 DIAGNOSIS — F430 Acute stress reaction: Secondary | ICD-10-CM

## 2021-05-29 DIAGNOSIS — Z8673 Personal history of transient ischemic attack (TIA), and cerebral infarction without residual deficits: Secondary | ICD-10-CM

## 2021-06-12 ENCOUNTER — Other Ambulatory Visit: Payer: Self-pay | Admitting: Family Medicine

## 2021-06-12 DIAGNOSIS — R351 Nocturia: Secondary | ICD-10-CM

## 2021-06-12 DIAGNOSIS — N529 Male erectile dysfunction, unspecified: Secondary | ICD-10-CM

## 2021-06-26 ENCOUNTER — Other Ambulatory Visit: Payer: Self-pay | Admitting: Family Medicine

## 2021-06-26 DIAGNOSIS — E119 Type 2 diabetes mellitus without complications: Secondary | ICD-10-CM

## 2021-06-26 DIAGNOSIS — Z8673 Personal history of transient ischemic attack (TIA), and cerebral infarction without residual deficits: Secondary | ICD-10-CM

## 2021-06-30 ENCOUNTER — Other Ambulatory Visit: Payer: Self-pay | Admitting: Family Medicine

## 2021-06-30 DIAGNOSIS — E119 Type 2 diabetes mellitus without complications: Secondary | ICD-10-CM

## 2021-07-01 NOTE — Telephone Encounter (Signed)
Called pt and LM to call office at [phone].

## 2021-07-01 NOTE — Telephone Encounter (Signed)
Requested medication (s) are due for refill today: yes  Requested medication (s) are on the active medication list: yes  Last refill:  05/30/21  Future visit scheduled: no- called pt and LM on VM to call office to schedule appointment  Notes to clinic:  med not assigned to protocol   Requested Prescriptions  Pending Prescriptions Disp Refills   OZEMPIC, 0.25 OR 0.5 MG/DOSE, 2 MG/3ML SOPN [Pharmacy Med Name: OZEMPIC 0.25-0.5 MG/DOSE PEN]      Sig: INJECT 0.25 MG SUBCUTANEOUSLY ONE TIME PER WEEK     There is no refill protocol information for this order

## 2021-07-01 NOTE — Telephone Encounter (Signed)
Patient not  seen since September. Needs office schedule before refill can be approved.

## 2021-07-04 NOTE — Telephone Encounter (Signed)
Pt called about refill request for Ozempic/ pt scheduled appt for July 3rd/ pt only wanted 8am slots and not many were available with Dr. Sherrie Mustache ./ please advise   Other med request from encounter were denied due to needing an appt

## 2021-07-11 ENCOUNTER — Other Ambulatory Visit: Payer: Self-pay | Admitting: Family Medicine

## 2021-07-11 DIAGNOSIS — G4701 Insomnia due to medical condition: Secondary | ICD-10-CM

## 2021-07-27 ENCOUNTER — Ambulatory Visit: Payer: BC Managed Care – PPO | Admitting: Family Medicine

## 2021-07-31 ENCOUNTER — Other Ambulatory Visit: Payer: Self-pay | Admitting: Family Medicine

## 2021-07-31 DIAGNOSIS — Z8673 Personal history of transient ischemic attack (TIA), and cerebral infarction without residual deficits: Secondary | ICD-10-CM

## 2021-08-10 ENCOUNTER — Other Ambulatory Visit: Payer: Self-pay | Admitting: Family Medicine

## 2021-08-10 DIAGNOSIS — I10 Essential (primary) hypertension: Secondary | ICD-10-CM

## 2021-08-10 NOTE — Telephone Encounter (Signed)
Requested medication (s) are due for refill today: yes  Requested medication (s) are on the active medication list: yes  Last refill:  05/30/21 #30/0  Future visit scheduled: yes in 5 days  Notes to clinic:  Unable to refill per protocol due to failed labs, no updated results.      Requested Prescriptions  Pending Prescriptions Disp Refills   lisinopril-hydrochlorothiazide (ZESTORETIC) 10-12.5 MG tablet [Pharmacy Med Name: LISINOPRIL-HCTZ 10-12.5 MG TAB] 30 tablet 0    Sig: TAKE 1 TABLET BY MOUTH EVERY DAY     Cardiovascular:  ACEI + Diuretic Combos Failed - 08/10/2021 12:35 PM      Failed - Na in normal range and within 180 days    Sodium  Date Value Ref Range Status  03/16/2020 136 134 - 144 mmol/L Final  06/04/2013 138 136 - 145 mmol/L Final         Failed - K in normal range and within 180 days    Potassium  Date Value Ref Range Status  03/16/2020 4.4 3.5 - 5.2 mmol/L Final  06/04/2013 3.6 3.5 - 5.1 mmol/L Final         Failed - Cr in normal range and within 180 days    Creatinine  Date Value Ref Range Status  06/04/2013 1.17 0.60 - 1.30 mg/dL Final   Creatinine, Ser  Date Value Ref Range Status  03/16/2020 1.07 0.76 - 1.27 mg/dL Final         Failed - eGFR is 30 or above and within 180 days    EGFR (African American)  Date Value Ref Range Status  06/04/2013 >60  Final   GFR calc Af Amer  Date Value Ref Range Status  03/16/2020 93 >59 mL/min/1.73 Final    Comment:    **In accordance with recommendations from the NKF-ASN Task force,**   [facility] is in the process of updating its eGFR calculation to the   2021 CKD-EPI creatinine equation that estimates kidney function   without a race variable.    EGFR (Non-African Amer.)  Date Value Ref Range Status  06/04/2013 >60  Final    Comment:    eGFR values <3m/min/1.73 m2 may be an indication of chronic kidney disease (CKD). Calculated eGFR is useful in patients with stable renal function. The eGFR  calculation will not be reliable in acutely ill patients when serum creatinine is changing rapidly. It is not useful in  patients on dialysis. The eGFR calculation may not be applicable to patients at the low and high extremes of body sizes, pregnant women, and vegetarians.    GFR calc non Af Amer  Date Value Ref Range Status  03/16/2020 81 >59 mL/min/1.73 Final         Failed - Valid encounter within last 6 months    Recent Outpatient Visits           9 months ago Essential hypertension, benign   [facility]   1 year ago Type 2 diabetes mellitus without complication, without long-term current use of insulin ([facility]   [facility]   1 year ago Annual physical exam   [facility]   1 year ago [place]  1 year ago Type 2 diabetes mellitus without complication, without long-term current use of insulin ([facility]   [facility]  Future Appointments             In 5 days Fisher, Kirstie Peri, MD [facility] [place] - Patient is not pregnant      Passed - Last BP in normal range    BP Readings from Last 1 Encounters:  10/26/20 123/69

## 2021-08-11 ENCOUNTER — Telehealth: Payer: Self-pay

## 2021-08-11 NOTE — Telephone Encounter (Signed)
Copied from CRM [id number]. Topic: General - Inquiry >> Aug 10, 2021  3:53 PM Melynda Keller J wrote: Reason for CRM: pt has appt on 7.14.23 / please advise if pt needs to be fasting for this appt

## 2021-08-13 ENCOUNTER — Other Ambulatory Visit: Payer: Self-pay | Admitting: Family Medicine

## 2021-08-13 DIAGNOSIS — F430 Acute stress reaction: Secondary | ICD-10-CM

## 2021-08-13 DIAGNOSIS — G4701 Insomnia due to medical condition: Secondary | ICD-10-CM

## 2021-08-13 DIAGNOSIS — Z8673 Personal history of transient ischemic attack (TIA), and cerebral infarction without residual deficits: Secondary | ICD-10-CM

## 2021-08-15 ENCOUNTER — Ambulatory Visit: Payer: BC Managed Care – PPO | Admitting: Family Medicine

## 2021-08-26 ENCOUNTER — Ambulatory Visit: Payer: BC Managed Care – PPO | Admitting: Family Medicine

## 2021-08-26 ENCOUNTER — Encounter: Payer: Self-pay | Admitting: Family Medicine

## 2021-08-26 VITALS — BP 123/80 | HR 95 | Temp 98.6°F | Resp 14 | Wt 251.0 lb

## 2021-08-26 DIAGNOSIS — Z125 Encounter for screening for malignant neoplasm of prostate: Secondary | ICD-10-CM

## 2021-08-26 DIAGNOSIS — I10 Essential (primary) hypertension: Secondary | ICD-10-CM

## 2021-08-26 DIAGNOSIS — E119 Type 2 diabetes mellitus without complications: Secondary | ICD-10-CM

## 2021-08-26 DIAGNOSIS — E669 Obesity, unspecified: Secondary | ICD-10-CM | POA: Diagnosis not present

## 2021-08-26 DIAGNOSIS — Z1211 Encounter for screening for malignant neoplasm of colon: Secondary | ICD-10-CM

## 2021-08-26 DIAGNOSIS — J069 Acute upper respiratory infection, unspecified: Secondary | ICD-10-CM

## 2021-08-26 NOTE — Patient Instructions (Addendum)
Please review the attached list of medications and notify my office if there are any errors.   Please bring all of your medications to every appointment so we can make sure that our medication list is the same as yours.   I would recommend increasing Ozempic to 0.5mg  once a week if your labs are normal

## 2021-08-26 NOTE — Progress Notes (Signed)
I,Preston Nguyen,acting as a scribe for Preston Merry, MD.,have documented all relevant documentation on the behalf of Preston Merry, MD,as directed by  Preston Merry, MD while in the presence of Preston Merry, MD.    Established patient visit   Patient: Preston Nguyen   DOB: 11/21/1970   51 y.o. Male  MRN: [id number] Visit Date: 08/26/2021  Today's healthcare provider: [name], MD   Chief Complaint  Patient presents with   Diabetes   Hypertension   Insomnia   Subjective    HPI  Diabetes Mellitus Type II, Follow-up  Lab Results  Component Value Date   HGBA1C 6.3 (H) 10/26/2020   HGBA1C 7.2 (A) 06/08/2020   HGBA1C 10.4 (H) 03/16/2020   Wt Readings from Last 3 Encounters:  08/26/21 251 lb (113.9 kg)  10/26/20 257 lb (116.6 kg)  06/08/20 258 lb (117 kg)   Last seen for diabetes 10 months ago.  Management since then includes continue same medication. He reports good compliance with treatment. He is not having side effects.  Symptoms: No fatigue No foot ulcerations  No appetite changes No nausea  No paresthesia of the feet  No polydipsia  No polyuria No visual disturbances   No vomiting     Home blood sugar records:  blood sugars are not checked  Episodes of hypoglycemia? No    Current insulin regiment: none Most Recent Eye Exam: >1 year ago Current exercise: none Current diet habits: in general, an "unhealthy" diet  Pertinent Labs: Lab Results  Component Value Date   CHOL 120 03/16/2020   HDL 40 03/16/2020   LDLCALC 60 03/16/2020   TRIG 111 03/16/2020   CHOLHDL 3.0 03/16/2020   Lab Results  Component Value Date   NA 136 03/16/2020   K 4.4 03/16/2020   CREATININE 1.07 03/16/2020   GFRNONAA 81 03/16/2020   MICROALBUR 20 10/26/2017     ---------------------------------------------------------------------------------------------------   Hypertension, follow-up  BP Readings from Last 3 Encounters:  08/26/21 123/80  10/26/20  123/69  06/08/20 105/72   Wt Readings from Last 3 Encounters:  08/26/21 251 lb (113.9 kg)  10/26/20 257 lb (116.6 kg)  06/08/20 258 lb (117 kg)     He was last seen for hypertension 6 months ago.  BP at that visit was 123/69. Management since that visit includes continue same medication.  He reports good compliance with treatment. He is not having side effects.  He is following a Regular diet. He is not exercising. He does not smoke.  Use of agents associated with hypertension: NSAIDS.   Outside blood pressures are not checked. Symptoms: No chest pain No chest pressure  No palpitations No syncope  No dyspnea No orthopnea  No paroxysmal nocturnal dyspnea No lower extremity edema    ---------------------------------------------------------------------------------------------------  Follow up for insomnia:  The patient was last seen for this more than 1 year ago.   Changes made at last visit include continuing same medications. Marland Kitchen  He reports good compliance with treatment. He feels that condition is Improved. He is not having side effects.   -----------------------------------------------------------------------------------------    Medications: Outpatient Medications Prior to Visit  Medication Sig   albuterol (VENTOLIN HFA) 108 (90 Base) MCG/ACT inhaler Inhale 2 puffs into the lungs every 6 (six) hours as needed for wheezing or shortness of breath.   aspirin 325 MG tablet Take 1 tablet (325 mg total) by mouth daily.   atorvastatin (LIPITOR) 40 MG tablet TAKE 1 TABLET BY MOUTH EVERY  DAY   fexofenadine (ALLEGRA) 180 MG tablet Take 180 mg by mouth daily as needed for allergies. Dose unkn   fluticasone (FLONASE) 50 MCG/ACT nasal spray Place 2 sprays into both nostrils daily.   ibuprofen (ADVIL,MOTRIN) 600 MG tablet as needed.   lisinopril-hydrochlorothiazide (ZESTORETIC) 10-12.5 MG tablet TAKE 1 TABLET BY MOUTH EVERY DAY   metFORMIN (GLUCOPHAGE-XR) 500 MG 24 hr tablet TAKE  2 TABLETS (1,000 MG TOTAL) BY MOUTH EVERY EVENING.   methocarbamol (ROBAXIN) 500 MG tablet TAKE 1 TABLET BY MOUTH 4 TIMES DAILY AS NEEDED.   Multiple Vitamin (MULTIVITAMIN WITH MINERALS) TABS tablet Take 1 tablet by mouth daily.   pioglitazone (ACTOS) 30 MG tablet TAKE 1.5 TABLETS BY MOUTH DAILY.   Semaglutide,0.25 or 0.5MG /DOS, (OZEMPIC, 0.25 OR 0.5 MG/DOSE,) 2 MG/3ML SOPN INJECT 0.25 MG SUBCUTANEOUSLY ONE TIME PER WEEK   tadalafil (CIALIS) 5 MG tablet TAKE ONE TABLET BY MOUTH ONE TIME DAILY   venlafaxine XR (EFFEXOR-XR) 37.5 MG 24 hr capsule TAKE 1 CAPSULE BY MOUTH EVERY DAY   zolpidem (AMBIEN) 10 MG tablet TAKE 1 TABLET BY MOUTH EVERY DAY AT BEDTIME AS NEEDED   No facility-administered medications prior to visit.         Objective    BP 123/80 (BP Location: Right Arm, Patient Position: Sitting, Cuff Size: Large)   Pulse 95   Temp 98.6 F (37 C) (Oral)   Resp 14   Wt 251 lb (113.9 kg)   SpO2 98% Comment: room air  BMI 34.04 kg/m    Physical Exam   General: Appearance:    Mildly obese male in no acute distress  Eyes:    PERRL, conjunctiva/corneas clear, EOM's intact       Lungs:     Clear to auscultation bilaterally, respirations unlabored  Heart:    Normal heart rate. Normal rhythm. No murmurs, rubs, or gallops.    MS:   All extremities are intact.    Neurologic:   Awake, alert, oriented x 3. No apparent focal neurological defect.         Assessment & Plan    1. Type 2 diabetes mellitus without complication, without long-term current use of insulin (HCC) Doing well current medications. Will recommend increasing Ozempic to 0.5mg  if labs are normal.  - Hemoglobin A1c  2. Primary hypertension Fairly well controlled. Continue current medications.   - CBC - Comprehensive metabolic panel - Lipid panel  3. Prostate cancer screening  - PSA Total (Reflex To Free) ([facility] only)  4. Obesity (BMI 30-39.9) Working on diet. Expect additional weight loss of Ozempic dose is  increased.   5. Upper respiratory tract infection, unspecified type Just started a few days ago. Normal exam. Mostly since drainage. May be allergy or weather related.   6. Colon cancer screening Encouraged screening with colonoscopy or Cologuard which he declined today.       The entirety of the information documented in the History of Present Illness, Review of Systems and Physical Exam were personally obtained by me. Portions of this information were initially documented by the CMA and reviewed by me for thoroughness and accuracy.     Preston Merry, MD  [facility] [phone] (phone) [phone] (fax)  [facility]

## 2021-08-27 LAB — COMPREHENSIVE METABOLIC PANEL
ALT: 37 IU/L (ref 0–44)
AST: 24 IU/L (ref 0–40)
Albumin/Globulin Ratio: 1.9 (ref 1.2–2.2)
Albumin: 4.6 g/dL (ref 3.8–4.9)
Alkaline Phosphatase: 74 IU/L (ref 44–121)
BUN/Creatinine Ratio: 19 (ref 9–20)
BUN: 17 mg/dL (ref 6–24)
Bilirubin Total: 0.5 mg/dL (ref 0.0–1.2)
CO2: 20 mmol/L (ref 20–29)
Calcium: 9.6 mg/dL (ref 8.7–10.2)
Chloride: 99 mmol/L (ref 96–106)
Creatinine, Ser: 0.91 mg/dL (ref 0.76–1.27)
Globulin, Total: 2.4 g/dL (ref 1.5–4.5)
Glucose: 131 mg/dL — ABNORMAL HIGH (ref 70–99)
Potassium: 4.4 mmol/L (ref 3.5–5.2)
Sodium: 139 mmol/L (ref 134–144)
Total Protein: 7 g/dL (ref 6.0–8.5)
eGFR: 102 mL/min/{1.73_m2} (ref >59)

## 2021-08-27 LAB — PSA TOTAL (REFLEX TO FREE): Prostate Specific Ag, Serum: 1.4 ng/mL (ref 0.0–4.0)

## 2021-08-27 LAB — LIPID PANEL
Chol/HDL Ratio: 2.6 ratio (ref 0.0–5.0)
Cholesterol, Total: 115 mg/dL (ref 100–199)
HDL: 45 mg/dL (ref >39)
LDL Chol Calc (NIH): 54 mg/dL (ref 0–99)
Triglycerides: 83 mg/dL (ref 0–149)
VLDL Cholesterol Cal: 16 mg/dL (ref 5–40)

## 2021-08-27 LAB — CBC
Hematocrit: 46.7 % (ref 37.5–51.0)
Hemoglobin: 15.7 g/dL (ref 13.0–17.7)
MCH: 30 pg (ref 26.6–33.0)
MCHC: 33.6 g/dL (ref 31.5–35.7)
MCV: 89 fL (ref 79–97)
Platelets: 259 10*3/uL (ref 150–450)
RBC: 5.23 x10E6/uL (ref 4.14–5.80)
RDW: 13 % (ref 11.6–15.4)
WBC: 7.9 10*3/uL (ref 3.4–10.8)

## 2021-08-27 LAB — HEMOGLOBIN A1C
Est. average glucose Bld gHb Est-mCnc: 143 mg/dL
Hgb A1c MFr Bld: 6.6 % — ABNORMAL HIGH (ref 4.8–5.6)

## 2021-08-27 LAB — MICROALBUMIN / CREATININE URINE RATIO
Creatinine, Urine: 257.7 mg/dL
Microalb/Creat Ratio: 5 mg/g creat (ref 0–29)
Microalbumin, Urine: 12.3 ug/mL

## 2021-09-04 ENCOUNTER — Other Ambulatory Visit: Payer: Self-pay | Admitting: Family Medicine

## 2021-09-04 DIAGNOSIS — F430 Acute stress reaction: Secondary | ICD-10-CM

## 2021-09-04 DIAGNOSIS — Z8673 Personal history of transient ischemic attack (TIA), and cerebral infarction without residual deficits: Secondary | ICD-10-CM

## 2021-09-04 DIAGNOSIS — E119 Type 2 diabetes mellitus without complications: Secondary | ICD-10-CM

## 2021-09-08 ENCOUNTER — Other Ambulatory Visit: Payer: Self-pay | Admitting: Family Medicine

## 2021-09-08 DIAGNOSIS — R351 Nocturia: Secondary | ICD-10-CM

## 2021-09-08 DIAGNOSIS — N529 Male erectile dysfunction, unspecified: Secondary | ICD-10-CM

## 2021-09-08 MED ORDER — TADALAFIL 5 MG PO TABS: 5.0000 mg | ORAL_TABLET | Freq: Every day | ORAL | 3 refills | Status: DC

## 2021-09-10 ENCOUNTER — Other Ambulatory Visit: Payer: Self-pay | Admitting: Family Medicine

## 2021-09-10 DIAGNOSIS — I10 Essential (primary) hypertension: Secondary | ICD-10-CM

## 2021-09-12 NOTE — Telephone Encounter (Signed)
Requested Prescriptions  Pending Prescriptions Disp Refills  . lisinopril-hydrochlorothiazide (ZESTORETIC) 10-12.5 MG tablet [Pharmacy Med Name: LISINOPRIL-HCTZ 10-12.5 MG TAB] 90 tablet 1    Sig: TAKE 1 TABLET BY MOUTH EVERY DAY     Cardiovascular:  ACEI + Diuretic Combos Passed - 09/10/2021  9:34 AM      Passed - Na in normal range and within 180 days    Sodium  Date Value Ref Range Status  08/26/2021 139 134 - 144 mmol/L Final  06/04/2013 138 136 - 145 mmol/L Final         Passed - K in normal range and within 180 days    Potassium  Date Value Ref Range Status  08/26/2021 4.4 3.5 - 5.2 mmol/L Final  06/04/2013 3.6 3.5 - 5.1 mmol/L Final         Passed - Cr in normal range and within 180 days    Creatinine  Date Value Ref Range Status  06/04/2013 1.17 0.60 - 1.30 mg/dL Final   Creatinine, Ser  Date Value Ref Range Status  08/26/2021 0.91 0.76 - 1.27 mg/dL Final         Passed - eGFR is 30 or above and within 180 days    EGFR (African American)  Date Value Ref Range Status  06/04/2013 >60  Final   GFR calc Af Amer  Date Value Ref Range Status  03/16/2020 93 >59 mL/min/1.73 Final    Comment:    **In accordance with recommendations from the NKF-ASN Task force,**   [facility] is in the process of updating its eGFR calculation to the   2021 CKD-EPI creatinine equation that estimates kidney function   without a race variable.    EGFR (Non-African Amer.)  Date Value Ref Range Status  06/04/2013 >60  Final    Comment:    eGFR values <62m/min/1.73 m2 may be an indication of chronic kidney disease (CKD). Calculated eGFR is useful in patients with stable renal function. The eGFR calculation will not be reliable in acutely ill patients when serum creatinine is changing rapidly. It is not useful in  patients on dialysis. The eGFR calculation may not be applicable to patients at the low and high extremes of body sizes, pregnant women, and vegetarians.    GFR calc non Af  Amer  Date Value Ref Range Status  03/16/2020 81 >59 mL/min/1.73 Final   eGFR  Date Value Ref Range Status  08/26/2021 102 >59 mL/min/1.73 Final         Passed - Patient is not pregnant      Passed - Last BP in normal range    BP Readings from Last 1 Encounters:  08/26/21 123/80         Passed - Valid encounter within last 6 months    Recent Outpatient Visits          2 weeks ago Type 2 diabetes mellitus without complication, without long-term current use of insulin ([facility]   10 months ago Essential hypertension, benign   [facility]   1 year ago Type 2 diabetes mellitus without complication, without long-term current use of insulin ([facility]   [facility]   1 year ago [facility]   [facility]   [place]

## 2021-11-22 ENCOUNTER — Telehealth: Payer: Self-pay | Admitting: Family Medicine

## 2021-11-22 DIAGNOSIS — G4701 Insomnia due to medical condition: Secondary | ICD-10-CM

## 2021-11-22 NOTE — Telephone Encounter (Signed)
Requested medication (s) are due for refill today: yes  Requested medication (s) are on the active medication list: yes  Last refill:  08/15/21 #30/3  Future visit scheduled: no  Notes to clinic:  Unable to refill per protocol, cannot delegate.      Requested Prescriptions  Pending Prescriptions Disp Refills   zolpidem (AMBIEN) 10 MG tablet 30 tablet 3    Sig: TAKE 1 TABLET BY MOUTH EVERY DAY AT BEDTIME AS NEEDED     Not Delegated - Psychiatry:  Anxiolytics/Hypnotics Failed - 11/22/2021  3:07 PM      Failed - This refill cannot be delegated      Failed - Urine Drug Screen completed in last 360 days      Passed - Valid encounter within last 6 months    Recent Outpatient Visits           2 months ago Type 2 diabetes mellitus without complication, without long-term current use of insulin [facility])   [facility] Birdie Sons, MD   1 year ago Essential hypertension, benign   [facility] Birdie Sons, MD   1 year ago Type 2 diabetes mellitus without complication, without long-term current use of insulin [facility])   [facility] Birdie Sons, MD   1 year ago Annual physical exam   [facility] Birdie Sons, MD   1 year ago [place], [place], [place]

## 2021-11-22 NOTE — Telephone Encounter (Signed)
Medication Refill - Medication: zolpidem (AMBIEN) 10 MG tablet  Has the patient contacted their pharmacy? Yes.   He picked up his last refill last week, but he wants his next refill to be a 90 day prescription. He said he does not know why it was switched to a 30, but he wants this changed.  Preferred Pharmacy (with phone number or street name): [id number] [address], [place] the patient been seen for an appointment in the last year OR does the patient have an upcoming appointment? Yes.    Agent: Please be advised that RX refills may take up to 3 business days. We ask that you follow-up with your pharmacy.

## 2021-11-23 MED ORDER — ZOLPIDEM TARTRATE 10 MG PO TABS: ORAL_TABLET | ORAL | 3 refills | Status: DC

## 2021-12-13 NOTE — Telephone Encounter (Signed)
This med is showing up as receipted by pharmacy for 3 refills qty of 30. Pt however said he had asked for a 1 time fill of 90 qty at his last visit. His last two visits were in July 2023 and then Sept 2022. I told pt he may need an appt but he declined. He is ask for a 90 day refill on his   zolpidem (AMBIEN) 10 MG tablet  Without an appt as pt states it was not his fault that an appt was not made for him at the time of last appt. Pls call pt to advise as he states he is not picking up this script.[phone]

## 2021-12-14 NOTE — Telephone Encounter (Signed)
Patient would like the Ambien prescription that is on file at the pharmacy to be changed to a 90 day supply. This prescription was sent in on 11/23/2021 for a qty of 30 tablets with 3 refills. Please advise if ok to change or send in new rx for a 90 day supply.   Also, please advise when patient is due to come back in the office for a follow up visit?

## 2021-12-23 ENCOUNTER — Telehealth: Payer: Self-pay | Admitting: Family Medicine

## 2021-12-23 DIAGNOSIS — G4701 Insomnia due to medical condition: Secondary | ICD-10-CM

## 2021-12-23 DIAGNOSIS — E119 Type 2 diabetes mellitus without complications: Secondary | ICD-10-CM

## 2021-12-23 MED ORDER — ZOLPIDEM TARTRATE 10 MG PO TABS: ORAL_TABLET | ORAL | 1 refills | Status: DC

## 2021-12-23 NOTE — Telephone Encounter (Signed)
Pt can not get Ozempic do to not being in stock at any pharmacies / pt asked if there is an alternative that can be sent to the pharmacy  or samples in office / pt also stated he would like his Ambien switch to a 90 day supply like his other meds / please advise

## 2021-12-23 NOTE — Telephone Encounter (Signed)
We can change to Trulicity 1.5mg , but it looks like [facility] just dispensed 2 month supply of Ozempic.  [facility]

## 2021-12-27 NOTE — Telephone Encounter (Signed)
He is due for follow up for diabetes in January. Please advised and schedule appointment.

## 2021-12-27 NOTE — Telephone Encounter (Signed)
Spoke with patient, he said he will call [facility] and see if they still have it for him and will call us back if not.

## 2021-12-27 NOTE — Telephone Encounter (Signed)
Patient called in , states [facility] doesn't have the Ozempic either and is asking for the [facility]  [facility] [place], [place] - [address] AT Portal to [facility] Phone: [phone]  Fax: [phone]

## 2021-12-28 MED ORDER — TRULICITY 0.75 MG/0.5ML ~~LOC~~ SOAJ: 0.7500 mg | SUBCUTANEOUS | 3 refills | Status: DC

## 2021-12-28 NOTE — Telephone Encounter (Signed)
Appointment scheduled.

## 2021-12-28 NOTE — Telephone Encounter (Signed)
Tried calling patient. Left message to call back. OK for [facility] to advise and schedule appointment.  

## 2022-01-01 ENCOUNTER — Telehealth: Payer: Self-pay | Admitting: Family

## 2022-01-01 DIAGNOSIS — J069 Acute upper respiratory infection, unspecified: Secondary | ICD-10-CM

## 2022-01-01 MED ORDER — BENZONATATE 100 MG PO CAPS: 100.0000 mg | ORAL_CAPSULE | Freq: Three times a day (TID) | ORAL | 0 refills | Status: DC | PRN

## 2022-01-01 MED ORDER — FLUTICASONE PROPIONATE 50 MCG/ACT NA SUSP: 2.0000 | Freq: Every day | NASAL | 6 refills | Status: AC

## 2022-01-01 NOTE — Progress Notes (Signed)

## 2022-01-06 ENCOUNTER — Other Ambulatory Visit: Payer: Self-pay | Admitting: Family Medicine

## 2022-01-06 DIAGNOSIS — G4701 Insomnia due to medical condition: Secondary | ICD-10-CM

## 2022-01-06 MED ORDER — ZOLPIDEM TARTRATE 10 MG PO TABS: ORAL_TABLET | ORAL | 1 refills | Status: DC

## 2022-01-29 ENCOUNTER — Other Ambulatory Visit: Payer: Self-pay | Admitting: Physician Assistant

## 2022-02-06 ENCOUNTER — Other Ambulatory Visit: Payer: Self-pay | Admitting: Family Medicine

## 2022-02-06 DIAGNOSIS — E119 Type 2 diabetes mellitus without complications: Secondary | ICD-10-CM

## 2022-03-02 ENCOUNTER — Other Ambulatory Visit: Payer: Self-pay | Admitting: Family Medicine

## 2022-03-02 DIAGNOSIS — E119 Type 2 diabetes mellitus without complications: Secondary | ICD-10-CM

## 2022-03-02 NOTE — Telephone Encounter (Signed)
Requested medication (s) are due for refill today: No  Requested medication (s) are on the active medication list: Yes  Last refill:  02/08/22  Future visit scheduled: Yes  Notes to clinic:  Pharmacy requests 90 day supply and diagnosis code.    Requested Prescriptions  Pending Prescriptions Disp Refills   pioglitazone (ACTOS) 30 MG tablet [Pharmacy Med Name: PIOGLITAZONE HCL 30 MG TABLET] 135 tablet 1    Sig: TAKE 1 AND 1/2 TABLETS DAILY BY MOUTH     Endocrinology:  Diabetes - Glitazones - pioglitazone Failed - 03/02/2022 10:32 AM      Failed - HBA1C is between 0 and 7.9 and within 180 days    Hemoglobin A1C  Date Value Ref Range Status  09/19/2016 6.0  Final   Hgb A1c MFr Bld  Date Value Ref Range Status  08/26/2021 6.6 (H) 4.8 - 5.6 % Final    Comment:             Prediabetes: 5.7 - 6.4          Diabetes: >6.4          Glycemic control for adults with diabetes: <7.0          Failed - Valid encounter within last 6 months    Recent Outpatient Visits           6 months ago Type 2 diabetes mellitus without complication, without long-term current use of insulin [facility])   [facility] Birdie Sons, MD   1 year ago Essential hypertension, benign   [facility], MD   1 year ago Type 2 diabetes mellitus without complication, without long-term current use of insulin [facility])   [facility] Birdie Sons, MD   1 year ago Annual physical exam   [facility] Birdie Sons, MD   2 years ago [place], [place], [place]       Future Appointments             In 4 days Fisher, Kirstie Peri, MD [facility], [place]

## 2022-03-06 ENCOUNTER — Other Ambulatory Visit: Payer: Self-pay | Admitting: Family Medicine

## 2022-03-06 ENCOUNTER — Encounter: Payer: Self-pay | Admitting: Family Medicine

## 2022-03-06 ENCOUNTER — Ambulatory Visit: Payer: BC Managed Care – PPO | Admitting: Family Medicine

## 2022-03-06 VITALS — BP 136/80 | HR 86 | Ht 72.0 in | Wt 262.0 lb

## 2022-03-06 DIAGNOSIS — E119 Type 2 diabetes mellitus without complications: Secondary | ICD-10-CM | POA: Diagnosis not present

## 2022-03-06 DIAGNOSIS — I10 Essential (primary) hypertension: Secondary | ICD-10-CM | POA: Diagnosis not present

## 2022-03-06 DIAGNOSIS — M542 Cervicalgia: Secondary | ICD-10-CM

## 2022-03-06 DIAGNOSIS — E669 Obesity, unspecified: Secondary | ICD-10-CM | POA: Diagnosis not present

## 2022-03-06 DIAGNOSIS — G8929 Other chronic pain: Secondary | ICD-10-CM

## 2022-03-06 DIAGNOSIS — Z8673 Personal history of transient ischemic attack (TIA), and cerebral infarction without residual deficits: Secondary | ICD-10-CM | POA: Diagnosis not present

## 2022-03-06 MED ORDER — METHOCARBAMOL 500 MG PO TABS: 500.0000 mg | ORAL_TABLET | Freq: Four times a day (QID) | ORAL | 2 refills | Status: DC | PRN

## 2022-03-06 NOTE — Progress Notes (Signed)
Established patient visit   Patient: Preston Nguyen   DOB: May 08, 1970   52 y.o. Male  MRN: [id number] Visit Date: 03/06/2022  Today's healthcare provider: Lelon Huh, MD   Chief Complaint  Patient presents with   Diabetes   Hypertension   Hyperlipidemia   Subjective    Diabetes Pertinent negatives for diabetes include no chest pain.  Hypertension Pertinent negatives include no chest pain, palpitations or shortness of breath.  Hyperlipidemia Pertinent negatives include no chest pain or shortness of breath.    Doing well on current medications. Is not checking blood sugars regularly, but feels well. Has not had eye exam in a few years. Tolerating semaglutide 0.5mg  without and adverse effects.   Lab Results  Component Value Date   HGBA1C 6.6 (H) 08/26/2021   HGBA1C 6.3 (H) 10/26/2020   HGBA1C 7.2 (A) 06/08/2020   Lab Results  Component Value Date   NA 139 08/26/2021   K 4.4 08/26/2021   CREATININE 0.91 08/26/2021   EGFR 102 08/26/2021   GFRNONAA 81 03/16/2020   GLUCOSE 131 (H) 08/26/2021   Lab Results  Component Value Date   CHOL 115 08/26/2021   HDL 45 08/26/2021   LDLCALC 54 08/26/2021   TRIG 83 08/26/2021   CHOLHDL 2.6 08/26/2021     Medications: Outpatient Medications Prior to Visit  Medication Sig   albuterol (VENTOLIN HFA) 108 (90 Base) MCG/ACT inhaler Inhale 2 puffs into the lungs every 6 (six) hours as needed for wheezing or shortness of breath.   aspirin 325 MG tablet Take 1 tablet (325 mg total) by mouth daily.   atorvastatin (LIPITOR) 40 MG tablet TAKE 1 TABLET BY MOUTH EVERY DAY   benzonatate (TESSALON PERLES) 100 MG capsule Take 1 capsule (100 mg total) by mouth 3 (three) times daily as needed.   fexofenadine (ALLEGRA) 180 MG tablet Take 180 mg by mouth daily as needed for allergies. Dose unkn   fluticasone (FLONASE) 50 MCG/ACT nasal spray Place 2 sprays into both nostrils daily.   ibuprofen (ADVIL,MOTRIN) 600 MG tablet as needed.    lisinopril-hydrochlorothiazide (ZESTORETIC) 10-12.5 MG tablet TAKE 1 TABLET BY MOUTH EVERY DAY   metFORMIN (GLUCOPHAGE-XR) 500 MG 24 hr tablet TAKE 2 TABLETS (1,000 MG TOTAL) BY MOUTH EVERY EVENING.   Multiple Vitamin (MULTIVITAMIN WITH MINERALS) TABS tablet Take 1 tablet by mouth daily.   pioglitazone (ACTOS) 30 MG tablet TAKE 1 AND 1/2 TABLETS BY MOUTH EVERY DAY   Semaglutide,0.25 or 0.5MG /DOS, (OZEMPIC, 0.25 OR 0.5 MG/DOSE,) 2 MG/3ML SOPN Inject 0.5 mg as directed once a week.   tadalafil (CIALIS) 5 MG tablet Take 1 tablet (5 mg total) by mouth daily.   venlafaxine XR (EFFEXOR-XR) 37.5 MG 24 hr capsule TAKE 1 CAPSULE BY MOUTH EVERY DAY   zolpidem (AMBIEN) 10 MG tablet TAKE 1 TABLET BY MOUTH EVERY DAY AT BEDTIME AS NEEDED   [DISCONTINUED] methocarbamol (ROBAXIN) 500 MG tablet TAKE 1 TABLET BY MOUTH 4 TIMES DAILY AS NEEDED.   No facility-administered medications prior to visit.    Review of Systems  Constitutional:  Negative for appetite change, chills and fever.  Respiratory:  Negative for chest tightness, shortness of breath and wheezing.   Cardiovascular:  Negative for chest pain and palpitations.  Gastrointestinal:  Negative for abdominal pain, nausea and vomiting.       Objective    BP 136/80 (BP Location: Left Arm, Patient Position: Sitting, Cuff Size: Large)   Pulse 86   Ht 6' (1.829  m)   Wt 262 lb (118.8 kg)   SpO2 99%   BMI 35.53 kg/m    Physical Exam   General: Appearance:    Obese male in no acute distress  Eyes:    PERRL, conjunctiva/corneas clear, EOM's intact       Lungs:     Clear to auscultation bilaterally, respirations unlabored  Heart:    Normal heart rate. Normal rhythm. No murmurs, rubs, or gallops.    MS:   All extremities are intact.    Neurologic:   Awake, alert, oriented x 3. No apparent focal neurological defect.         Assessment & Plan     1. Type 2 diabetes mellitus without complication, without long-term current use of insulin  (HCC) Doing well with current medications.  - Comprehensive metabolic panel - Lipid panel - Hemoglobin A1c  He is interested in increasing to 1mg  after reviewing labs.  Recommend he follow up with his eye doctor for diabetic eye exam 2. Obesity (BMI 30-39.9)   3. Primary hypertension BP near goal. Anticipated BP improving if he loses weight.   4. History of stroke Asymptomatic. Compliant with medication.  Continue aggressive risk factor modification.    5. Chronic neck pain He requests refill  methocarbamol (ROBAXIN) 500 MG tablet; Take 1 tablet (500 mg total) by mouth 4 (four) times daily as needed.  Dispense: 90 tablet; Refill: 2      The entirety of the information documented in the History of Present Illness, Review of Systems and Physical Exam were personally obtained by me. Portions of this information were initially documented by the CMA and reviewed by me for thoroughness and accuracy.     Lelon Huh, MD  [place] [phone] (phone) [phone] (fax)  [place] .

## 2022-03-07 ENCOUNTER — Other Ambulatory Visit: Payer: Self-pay | Admitting: Family Medicine

## 2022-03-07 DIAGNOSIS — Z8673 Personal history of transient ischemic attack (TIA), and cerebral infarction without residual deficits: Secondary | ICD-10-CM

## 2022-03-07 DIAGNOSIS — I10 Essential (primary) hypertension: Secondary | ICD-10-CM

## 2022-03-07 DIAGNOSIS — F430 Acute stress reaction: Secondary | ICD-10-CM

## 2022-03-07 DIAGNOSIS — E119 Type 2 diabetes mellitus without complications: Secondary | ICD-10-CM

## 2022-03-07 LAB — COMPREHENSIVE METABOLIC PANEL
ALT: 59 IU/L — ABNORMAL HIGH (ref 0–44)
AST: 29 IU/L (ref 0–40)
Albumin/Globulin Ratio: 2.1 (ref 1.2–2.2)
Albumin: 4.6 g/dL (ref 3.8–4.9)
Alkaline Phosphatase: 71 IU/L (ref 44–121)
BUN/Creatinine Ratio: 22 — ABNORMAL HIGH (ref 9–20)
BUN: 23 mg/dL (ref 6–24)
Bilirubin Total: 0.5 mg/dL (ref 0.0–1.2)
CO2: 23 mmol/L (ref 20–29)
Calcium: 9.4 mg/dL (ref 8.7–10.2)
Chloride: 97 mmol/L (ref 96–106)
Creatinine, Ser: 1.05 mg/dL (ref 0.76–1.27)
Globulin, Total: 2.2 g/dL (ref 1.5–4.5)
Glucose: 161 mg/dL — ABNORMAL HIGH (ref 70–99)
Potassium: 4.3 mmol/L (ref 3.5–5.2)
Sodium: 136 mmol/L (ref 134–144)
Total Protein: 6.8 g/dL (ref 6.0–8.5)
eGFR: 85 mL/min/{1.73_m2} (ref >59)

## 2022-03-07 LAB — LIPID PANEL
Chol/HDL Ratio: 2.8 ratio (ref 0.0–5.0)
Cholesterol, Total: 124 mg/dL (ref 100–199)
HDL: 45 mg/dL (ref >39)
LDL Chol Calc (NIH): 61 mg/dL (ref 0–99)
Triglycerides: 97 mg/dL (ref 0–149)
VLDL Cholesterol Cal: 18 mg/dL (ref 5–40)

## 2022-03-07 LAB — HEMOGLOBIN A1C
Est. average glucose Bld gHb Est-mCnc: 192 mg/dL
Hgb A1c MFr Bld: 8.3 % — ABNORMAL HIGH (ref 4.8–5.6)

## 2022-03-07 NOTE — Telephone Encounter (Signed)
Requested medication (s) are due for refill today: yes  Requested medication (s) are on the active medication list: yes  Last refill:  02/08/22  Future visit scheduled:no  Notes to clinic:  Unable to refill per protocol, courtesy refill already given, routing for provider approval.      Requested Prescriptions  Pending Prescriptions Disp Refills   pioglitazone (ACTOS) 30 MG tablet [Pharmacy Med Name: PIOGLITAZONE HCL 30 MG TABLET] 45 tablet 0    Sig: TAKE 1 AND 1/2 TABLETS DAILY BY MOUTH     Endocrinology:  Diabetes - Glitazones - pioglitazone Failed - 03/06/2022  1:31 AM      Failed - HBA1C is between 0 and 7.9 and within 180 days    Hemoglobin A1C  Date Value Ref Range Status  09/19/2016 6.0  Final   Hgb A1c MFr Bld  Date Value Ref Range Status  03/06/2022 8.3 (H) 4.8 - 5.6 % Final    Comment:             Prediabetes: 5.7 - 6.4          Diabetes: >6.4          Glycemic control for adults with diabetes: <7.0          Failed - Valid encounter within last 6 months    Recent Outpatient Visits           Yesterday Type 2 diabetes mellitus without complication, without long-term current use of insulin ([place])   [place] Birdie Sons, MD   6 months ago Type 2 diabetes mellitus without complication, without long-term current use of insulin ([place])   [place] [facility] Birdie Sons, MD   1 year ago Essential hypertension, benign   [place] Birdie Sons, MD   1 year ago Type 2 diabetes mellitus without complication, without long-term current use of insulin ([place])   [place] [facility] Birdie Sons, MD   1 year ago Annual physical exam   [place], Donald E, MD

## 2022-03-14 ENCOUNTER — Other Ambulatory Visit: Payer: Self-pay | Admitting: Family Medicine

## 2022-03-14 DIAGNOSIS — E119 Type 2 diabetes mellitus without complications: Secondary | ICD-10-CM

## 2022-03-14 MED ORDER — SEMAGLUTIDE (1 MG/DOSE) 4 MG/3ML ~~LOC~~ SOPN: 1.0000 mg | PEN_INJECTOR | SUBCUTANEOUS | 2 refills | Status: DC

## 2022-03-29 ENCOUNTER — Other Ambulatory Visit: Payer: Self-pay | Admitting: Family Medicine

## 2022-03-29 NOTE — Telephone Encounter (Signed)
Rx dose was increased on 03/14/22 by provider.   Requested Prescriptions  Refused Prescriptions Disp Refills   OZEMPIC, 0.25 OR 0.5 MG/DOSE, 2 MG/3ML SOPN [Pharmacy Med Name: OZEMPIC 0.25-0.5 MG/DOSE PEN]  1    Sig: INJECT 0.5 MG AS DIRECTED ONE TIME PER WEEK     Endocrinology:  Diabetes - GLP-1 Receptor Agonists - semaglutide Failed - 03/29/2022  2:49 AM      Failed - HBA1C in normal range and within 180 days    Hemoglobin A1C  Date Value Ref Range Status  09/19/2016 6.0  Final   Hgb A1c MFr Bld  Date Value Ref Range Status  03/06/2022 8.3 (H) 4.8 - 5.6 % Final    Comment:             Prediabetes: 5.7 - 6.4          Diabetes: >6.4          Glycemic control for adults with diabetes: <7.0          Passed - Cr in normal range and within 360 days    Creatinine  Date Value Ref Range Status  06/04/2013 1.17 0.60 - 1.30 mg/dL Final   Creatinine, Ser  Date Value Ref Range Status  03/06/2022 1.05 0.76 - 1.27 mg/dL Final         Passed - Valid encounter within last 6 months    Recent Outpatient Visits           3 weeks ago Type 2 diabetes mellitus without complication, without long-term current use of insulin ([place])   [place] Birdie Sons, MD   7 months ago Type 2 diabetes mellitus without complication, without long-term current use of insulin ([place])   [place] [facility] Birdie Sons, MD   1 year ago Essential hypertension, benign   [place], Donald E, MD   1 year ago Type 2 diabetes mellitus without complication, without long-term current use of insulin ([place])   [place] [facility] Birdie Sons, MD   2 years ago Annual physical exam   [facility] Birdie Sons, MD

## 2022-04-02 ENCOUNTER — Other Ambulatory Visit: Payer: Self-pay | Admitting: Family Medicine

## 2022-04-02 DIAGNOSIS — E119 Type 2 diabetes mellitus without complications: Secondary | ICD-10-CM

## 2022-05-01 ENCOUNTER — Other Ambulatory Visit: Payer: Self-pay | Admitting: Family Medicine

## 2022-05-01 DIAGNOSIS — E119 Type 2 diabetes mellitus without complications: Secondary | ICD-10-CM

## 2022-06-05 ENCOUNTER — Other Ambulatory Visit: Payer: Self-pay | Admitting: Family Medicine

## 2022-06-05 DIAGNOSIS — E119 Type 2 diabetes mellitus without complications: Secondary | ICD-10-CM

## 2022-07-09 ENCOUNTER — Other Ambulatory Visit: Payer: Self-pay | Admitting: Family Medicine

## 2022-07-09 DIAGNOSIS — G8929 Other chronic pain: Secondary | ICD-10-CM

## 2022-07-09 DIAGNOSIS — Z8673 Personal history of transient ischemic attack (TIA), and cerebral infarction without residual deficits: Secondary | ICD-10-CM

## 2022-07-09 DIAGNOSIS — F430 Acute stress reaction: Secondary | ICD-10-CM

## 2022-07-09 DIAGNOSIS — E119 Type 2 diabetes mellitus without complications: Secondary | ICD-10-CM

## 2022-07-09 DIAGNOSIS — I10 Essential (primary) hypertension: Secondary | ICD-10-CM

## 2022-07-12 ENCOUNTER — Other Ambulatory Visit: Payer: Self-pay | Admitting: Family Medicine

## 2022-07-12 DIAGNOSIS — G4701 Insomnia due to medical condition: Secondary | ICD-10-CM

## 2022-07-13 NOTE — Telephone Encounter (Signed)
Pt returned the call to schedule an appointment. Pt was unable to schedule an appointment in the next few days due to his employer and going out of town next week on vacation.  Pt asked if it was possible to get a courtesy refill to get him through until his appointment.  zolpidem (AMBIEN) 10 MG tablet   Please advise.

## 2022-07-13 NOTE — Telephone Encounter (Signed)
Lmtcb to schedule fu before any refills sent.

## 2022-08-29 ENCOUNTER — Other Ambulatory Visit: Payer: Self-pay | Admitting: Family Medicine

## 2022-08-29 DIAGNOSIS — N529 Male erectile dysfunction, unspecified: Secondary | ICD-10-CM

## 2022-08-29 DIAGNOSIS — R351 Nocturia: Secondary | ICD-10-CM

## 2022-08-30 ENCOUNTER — Ambulatory Visit: Payer: BC Managed Care – PPO | Admitting: Family Medicine

## 2022-08-30 ENCOUNTER — Other Ambulatory Visit: Payer: Self-pay | Admitting: Family Medicine

## 2022-08-30 ENCOUNTER — Encounter: Payer: Self-pay | Admitting: Family Medicine

## 2022-08-30 VITALS — BP 123/80 | HR 89 | Temp 97.7°F | Resp 12 | Ht 72.0 in | Wt 254.0 lb

## 2022-08-30 DIAGNOSIS — I10 Essential (primary) hypertension: Secondary | ICD-10-CM | POA: Diagnosis not present

## 2022-08-30 DIAGNOSIS — E119 Type 2 diabetes mellitus without complications: Secondary | ICD-10-CM

## 2022-08-30 LAB — POCT GLYCOSYLATED HEMOGLOBIN (HGB A1C)
Est. average glucose Bld gHb Est-mCnc: 143
Hemoglobin A1C: 6.6 % — AB (ref 4.0–5.6)

## 2022-08-30 NOTE — Progress Notes (Signed)
Established patient visit   Patient: Preston Nguyen   DOB: 1970/10/18   52 y.o. Male  MRN: [id number] Visit Date: 08/30/2022  Today's healthcare provider: [name], MD   Chief Complaint  Patient presents with   Hyperlipidemia   Hypertension   Diabetes   Subjective    HPI HPI   Last ov 03/06/22 ozempic was increased to 1 mg weekly. Patient reports good tolerance and compliance with medications. Denies any abnormal side effects. He reports not checking is sugars often and not checking BP at home. He denies any chest pain, shortness of breath, swelling on feet or ankles. Patient has seen eye doctor in last year. Will schedule.  Last edited by Myles Lipps, CMA on 08/30/2022  8:14 AM.      Lab Results  Component Value Date   HGBA1C 6.6 (A) 08/30/2022   HGBA1C 8.3 (H) 03/06/2022   HGBA1C 6.6 (H) 08/26/2021   Lab Results  Component Value Date   NA 136 03/06/2022   K 4.3 03/06/2022   CREATININE 1.05 03/06/2022   EGFR 85 03/06/2022   GLUCOSE 161 (H) 03/06/2022   Lab Results  Component Value Date   CHOL 124 03/06/2022   HDL 45 03/06/2022   LDLCALC 61 03/06/2022   TRIG 97 03/06/2022   CHOLHDL 2.8 03/06/2022     Medications: Outpatient Medications Prior to Visit  Medication Sig   albuterol (VENTOLIN HFA) 108 (90 Base) MCG/ACT inhaler Inhale 2 puffs into the lungs every 6 (six) hours as needed for wheezing or shortness of breath.   aspirin 325 MG tablet Take 1 tablet (325 mg total) by mouth daily.   atorvastatin (LIPITOR) 40 MG tablet TAKE 1 TABLET BY MOUTH EVERY DAY   benzonatate (TESSALON PERLES) 100 MG capsule Take 1 capsule (100 mg total) by mouth 3 (three) times daily as needed.   fexofenadine (ALLEGRA) 180 MG tablet Take 180 mg by mouth daily as needed for allergies. Dose unkn   fluticasone (FLONASE) 50 MCG/ACT nasal spray Place 2 sprays into both nostrils daily.   ibuprofen (ADVIL,MOTRIN) 600 MG tablet as needed.    lisinopril-hydrochlorothiazide (ZESTORETIC) 10-12.5 MG tablet Take 1 tablet by mouth daily. Please schedule office visit before any future refill   metFORMIN (GLUCOPHAGE-XR) 500 MG 24 hr tablet TAKE 2 TABLETS (1,000 MG TOTAL) BY MOUTH EVERY EVENING.   methocarbamol (ROBAXIN) 500 MG tablet TAKE 1 TABLET BY MOUTH 4 TIMES DAILY AS NEEDED.   Multiple Vitamin (MULTIVITAMIN WITH MINERALS) TABS tablet Take 1 tablet by mouth daily.   pioglitazone (ACTOS) 30 MG tablet Take 1.5 tablets (45 mg total) by mouth daily. Please schedule office visit before any future refill   Semaglutide, 1 MG/DOSE, (OZEMPIC, 1 MG/DOSE,) 4 MG/3ML SOPN INJECT 1 MG ONCE A WEEK AS DIRECTED   tadalafil (CIALIS) 5 MG tablet TAKE ONE TABLET BY MOUTH ONE TIME DAILY   venlafaxine XR (EFFEXOR-XR) 37.5 MG 24 hr capsule Take 1 capsule (37.5 mg total) by mouth daily. Please schedule office visit before any future refill   zolpidem (AMBIEN) 10 MG tablet TAKE 1 TABLET BY MOUTH EVERY DAY AT BEDTIME AS NEEDED   [DISCONTINUED] Semaglutide,0.25 or 0.5MG /DOS, (OZEMPIC, 0.25 OR 0.5 MG/DOSE,) 2 MG/3ML SOPN Inject 0.5 mg as directed once a week. Please schedule office visit before any future refill   No facility-administered medications prior to visit.        Objective    BP 123/80 (BP Location: Right Arm, Patient Position: Sitting, Cuff  Size: Large)   Pulse 89   Temp 97.7 F (36.5 C) (Temporal)   Resp 12   Ht 6' (1.829 m)   Wt 254 lb (115.2 kg)   SpO2 97%   BMI 34.45 kg/m   Physical Exam  General appearance: Obese male, cooperative and in no acute distress Head: Normocephalic, without obvious abnormality, atraumatic Respiratory: Respirations even and unlabored, normal respiratory rate Extremities: All extremities are intact.  Skin: Skin color, texture, turgor normal. No rashes seen  Psych: Appropriate mood and affect. Neurologic: Mental status: Alert, oriented to person, place, and time, thought content appropriate.   Results for  orders placed or performed in visit on 08/30/22  POCT glycosylated hemoglobin (Hb A1C)  Result Value Ref Range   Hemoglobin A1C 6.6 (A) 4.0 - 5.6 %   Est. average glucose Bld gHb Est-mCnc 143     Assessment & Plan     1. Type 2 diabetes mellitus without complication, without long-term current use of insulin (HCC) A1c near goal, tolerating increase to 1mg  semaglutide fairly well, although has a little nausea for about a day after injections. Continue same dose for now. Continue all other other medications unchanged.   2. Primary hypertension Well controlled.  Continue current medications.    Future Appointments  Date Time Provider Department Center  02/27/2023  8:20 AM Malva Limes, MD [facility]         The entirety of the information documented in the History of Present Illness, Review of Systems and Physical Exam were personally obtained by me. Portions of this information were initially documented by the CMA and reviewed by me for thoroughness and accuracy.     [name], MD  [facility] [phone] (phone) [phone] (fax)  [facility]

## 2022-08-30 NOTE — Patient Instructions (Addendum)
Please contact your eyecare professional to schedule a routine eye exam  . Please review the attached list of medications and notify my office if there are any errors.   . Please bring all of your medications to every appointment so we can make sure that our medication list is the same as yours.

## 2022-09-04 ENCOUNTER — Other Ambulatory Visit: Payer: Self-pay | Admitting: Family Medicine

## 2022-09-04 DIAGNOSIS — G8929 Other chronic pain: Secondary | ICD-10-CM

## 2022-10-01 ENCOUNTER — Other Ambulatory Visit: Payer: Self-pay | Admitting: Family Medicine

## 2022-10-01 DIAGNOSIS — G4701 Insomnia due to medical condition: Secondary | ICD-10-CM

## 2022-10-03 LAB — HM DIABETES EYE EXAM

## 2022-10-04 ENCOUNTER — Encounter: Payer: Self-pay | Admitting: Family Medicine

## 2022-10-07 ENCOUNTER — Other Ambulatory Visit: Payer: Self-pay | Admitting: Family Medicine

## 2022-10-07 DIAGNOSIS — E119 Type 2 diabetes mellitus without complications: Secondary | ICD-10-CM

## 2022-10-09 NOTE — Telephone Encounter (Signed)
Requested Prescriptions  Pending Prescriptions Disp Refills   pioglitazone (ACTOS) 30 MG tablet [Pharmacy Med Name: PIOGLITAZONE HCL 30 MG TABLET] 135 tablet 0    Sig: TAKE 1.5 TABLETS (45 MG TOTAL) BY MOUTH DAILY. PLEASE SCHEDULE OFFICE VISIT BEFORE ANY FUTURE REFILL     Endocrinology:  Diabetes - Glitazones - pioglitazone Passed - 10/07/2022  9:06 AM      Passed - HBA1C is between 0 and 7.9 and within 180 days    Hemoglobin A1C  Date Value Ref Range Status  08/30/2022 6.6 (A) 4.0 - 5.6 % Final  09/19/2016 6.0  Final   Hgb A1c MFr Bld  Date Value Ref Range Status  03/06/2022 8.3 (H) 4.8 - 5.6 % Final    Comment:             Prediabetes: 5.7 - 6.4          Diabetes: >6.4          Glycemic control for adults with diabetes: <7.0          Passed - Valid encounter within last 6 months    Recent Outpatient Visits           1 month ago Type 2 diabetes mellitus without complication, without long-term current use of insulin (HCC)   [place] [facility] Malva Limes, MD   7 months ago Type 2 diabetes mellitus without complication, without long-term current use of insulin (HCC)   [place] [facility] Malva Limes, MD   1 year ago Type 2 diabetes mellitus without complication, without long-term current use of insulin (HCC)   [place] [facility] Malva Limes, MD   1 year ago Essential hypertension, benign   [place] [facility] Malva Limes, MD   2 years ago Type 2 diabetes mellitus without complication, without long-term current use of insulin (HCC)   [place] [facility] Malva Limes, MD       Future Appointments             In 4 months Fisher, Demetrios Isaacs, MD [facility]

## 2022-11-23 ENCOUNTER — Other Ambulatory Visit: Payer: Self-pay | Admitting: Family Medicine

## 2022-11-23 DIAGNOSIS — G8929 Other chronic pain: Secondary | ICD-10-CM

## 2022-11-23 NOTE — Telephone Encounter (Signed)
Requested medication (s) are due for refill today: yes   Requested medication (s) are on the active medication list: yes   Last refill:  09/04/22 #90 1 refills   Future visit scheduled: yes in 3 months   Notes to clinic:  not delegated per protocol . Do you want to refill Rx?     Requested Prescriptions  Pending Prescriptions Disp Refills   methocarbamol (ROBAXIN) 500 MG tablet [Pharmacy Med Name: METHOCARBAMOL 500 MG TABLET] 90 tablet 1    Sig: TAKE 1 TABLET BY MOUTH 4 TIMES DAILY AS NEEDED.     Not Delegated - Analgesics:  Muscle Relaxants Failed - 11/23/2022  9:49 AM      Failed - This refill cannot be delegated      Passed - Valid encounter within last 6 months    Recent Outpatient Visits           2 months ago Type 2 diabetes mellitus without complication, without long-term current use of insulin (HCC)   [place] [facility] Malva Limes, MD   8 months ago Type 2 diabetes mellitus without complication, without long-term current use of insulin (HCC)   [place] [facility] Malva Limes, MD   1 year ago Type 2 diabetes mellitus without complication, without long-term current use of insulin (HCC)   [place] [facility] Malva Limes, MD   2 years ago Essential hypertension, benign   [place] [facility] Malva Limes, MD   2 years ago Type 2 diabetes mellitus without complication, without long-term current use of insulin (HCC)    [facility] Malva Limes, MD       Future Appointments             In 3 months Fisher, Demetrios Isaacs, MD [facility], [facility]

## 2022-11-26 ENCOUNTER — Other Ambulatory Visit: Payer: Self-pay | Admitting: Family Medicine

## 2022-11-26 DIAGNOSIS — R351 Nocturia: Secondary | ICD-10-CM

## 2022-11-26 DIAGNOSIS — E119 Type 2 diabetes mellitus without complications: Secondary | ICD-10-CM

## 2022-11-26 DIAGNOSIS — N529 Male erectile dysfunction, unspecified: Secondary | ICD-10-CM

## 2022-11-27 NOTE — Telephone Encounter (Signed)
Requested Prescriptions  Pending Prescriptions Disp Refills   tadalafil (CIALIS) 5 MG tablet [Pharmacy Med Name: Tadalafil Oral Tablet 5 MG] 90 tablet 0    Sig: TAKE ONE TABLET BY MOUTH ONCE A DAY     Urology: Erectile Dysfunction Agents Failed - 11/26/2022  9:04 AM      Failed - ALT in normal range and within 360 days    ALT  Date Value Ref Range Status  03/06/2022 59 (H) 0 - 44 IU/L Final   SGPT (ALT)  Date Value Ref Range Status  06/04/2013 26 12 - 78 U/L Final         Passed - AST in normal range and within 360 days    AST  Date Value Ref Range Status  03/06/2022 29 0 - 40 IU/L Final   SGOT(AST)  Date Value Ref Range Status  06/04/2013 19 15 - 37 Unit/L Final         Passed - Last BP in normal range    BP Readings from Last 1 Encounters:  08/30/22 123/80         Passed - Valid encounter within last 12 months    Recent Outpatient Visits           2 months ago Type 2 diabetes mellitus without complication, without long-term current use of insulin (HCC)   [place] [facility] Malva Limes, MD   8 months ago Type 2 diabetes mellitus without complication, without long-term current use of insulin (HCC)   [place] [facility] Malva Limes, MD   1 year ago Type 2 diabetes mellitus without complication, without long-term current use of insulin (HCC)   [place] [facility] Malva Limes, MD   2 years ago Essential hypertension, benign   [place] [facility] Malva Limes, MD   2 years ago Type 2 diabetes mellitus without complication, without long-term current use of insulin (HCC)   [place] [facility] Malva Limes, MD       Future Appointments             In 3 months Fisher, Demetrios Isaacs, MD [facility], [facility]

## 2022-11-27 NOTE — Telephone Encounter (Signed)
Requested Prescriptions  Pending Prescriptions Disp Refills   Semaglutide, 1 MG/DOSE, (OZEMPIC, 1 MG/DOSE,) 4 MG/3ML SOPN [Pharmacy Med Name: OZEMPIC 4 MG/3 ML (1 MG/DOSE)] 3 mL 2    Sig: INJECT 1 MG ONCE A WEEK AS DIRECTED     Endocrinology:  Diabetes - GLP-1 Receptor Agonists - semaglutide Failed - 11/26/2022  4:08 PM      Failed - HBA1C in normal range and within 180 days    Hemoglobin A1C  Date Value Ref Range Status  08/30/2022 6.6 (A) 4.0 - 5.6 % Final  09/19/2016 6.0  Final   Hgb A1c MFr Bld  Date Value Ref Range Status  03/06/2022 8.3 (H) 4.8 - 5.6 % Final    Comment:             Prediabetes: 5.7 - 6.4          Diabetes: >6.4          Glycemic control for adults with diabetes: <7.0          Passed - Cr in normal range and within 360 days    Creatinine  Date Value Ref Range Status  06/04/2013 1.17 0.60 - 1.30 mg/dL Final   Creatinine, Ser  Date Value Ref Range Status  03/06/2022 1.05 0.76 - 1.27 mg/dL Final         Passed - Valid encounter within last 6 months    Recent Outpatient Visits           2 months ago Type 2 diabetes mellitus without complication, without long-term current use of insulin (HCC)   [place] [facility] Malva Limes, MD   8 months ago Type 2 diabetes mellitus without complication, without long-term current use of insulin (HCC)   [place] [facility] Malva Limes, MD   1 year ago Type 2 diabetes mellitus without complication, without long-term current use of insulin (HCC)   [place] [facility] Malva Limes, MD   2 years ago Essential hypertension, benign    [facility] L[facility], [facility]   2 years ago Type 2 diabetes mellitus without complication, without long-term current use of insulin (HCC)    [facility] Malva Limes, MD       Future Appointments             In 3 months Fisher, Demetrios Isaacs, MD [facility], [facility]

## 2022-12-07 ENCOUNTER — Other Ambulatory Visit: Payer: Self-pay | Admitting: Family Medicine

## 2022-12-07 DIAGNOSIS — F430 Acute stress reaction: Secondary | ICD-10-CM

## 2022-12-07 DIAGNOSIS — I10 Essential (primary) hypertension: Secondary | ICD-10-CM

## 2023-01-09 ENCOUNTER — Other Ambulatory Visit: Payer: Self-pay | Admitting: Family Medicine

## 2023-01-09 DIAGNOSIS — E119 Type 2 diabetes mellitus without complications: Secondary | ICD-10-CM

## 2023-02-04 ENCOUNTER — Encounter: Payer: Self-pay | Admitting: Family Medicine

## 2023-02-04 DIAGNOSIS — R351 Nocturia: Secondary | ICD-10-CM

## 2023-02-04 DIAGNOSIS — E119 Type 2 diabetes mellitus without complications: Secondary | ICD-10-CM

## 2023-02-04 DIAGNOSIS — N529 Male erectile dysfunction, unspecified: Secondary | ICD-10-CM

## 2023-02-05 MED ORDER — TADALAFIL 10 MG PO TABS
10.0000 mg | ORAL_TABLET | Freq: Every day | ORAL | 1 refills | Status: DC | PRN
Start: 2023-02-05 — End: 2023-07-03

## 2023-02-05 MED ORDER — SEMAGLUTIDE (2 MG/DOSE) 8 MG/3ML ~~LOC~~ SOPN
2.0000 mg | PEN_INJECTOR | SUBCUTANEOUS | 2 refills | Status: DC
Start: 2023-02-05 — End: 2023-04-07

## 2023-02-14 ENCOUNTER — Other Ambulatory Visit: Payer: Self-pay | Admitting: Family Medicine

## 2023-02-14 DIAGNOSIS — E119 Type 2 diabetes mellitus without complications: Secondary | ICD-10-CM

## 2023-02-17 NOTE — Telephone Encounter (Signed)
 Dose change to 2 mg and sent to pharmacy on 02/05/23, will refuse this request.  Requested Prescriptions  Pending Prescriptions Disp Refills   OZEMPIC , 1 MG/DOSE, 4 MG/3ML SOPN [Pharmacy Med Name: OZEMPIC  4 MG/3 ML (1 MG/DOSE)]  2    Sig: INJECT 1 MG ONCE A WEEK AS DIRECTED     Endocrinology:  Diabetes - GLP-1 Receptor Agonists - semaglutide  Failed - 02/17/2023 12:31 PM      Failed - HBA1C in normal range and within 180 days    Hemoglobin A1C  Date Value Ref Range Status  08/30/2022 6.6 (A) 4.0 - 5.6 % Final  09/19/2016 6.0  Final   Hgb A1c MFr Bld  Date Value Ref Range Status  03/06/2022 8.3 (H) 4.8 - 5.6 % Final    Comment:             Prediabetes: 5.7 - 6.4          Diabetes: >6.4          Glycemic control for adults with diabetes: <7.0          Passed - Cr in normal range and within 360 days    Creatinine  Date Value Ref Range Status  06/04/2013 1.17 0.60 - 1.30 mg/dL Final   Creatinine, Ser  Date Value Ref Range Status  03/06/2022 1.05 0.76 - 1.27 mg/dL Final         Passed - Valid encounter within last 6 months    Recent Outpatient Visits           5 months ago Type 2 diabetes mellitus without complication, without long-term current use of insulin (HCC)   [place] [facility] Nancyann BRAVO, MD   11 months ago Type 2 diabetes mellitus without complication, without long-term current use of insulin (HCC)   [place] [facility] [place] Nancyann BRAVO, MD   1 year ago Type 2 diabetes mellitus without complication, without long-term current use of insulin (HCC)   [place] [facility] Nancyann BRAVO, MD   2 years ago Essential hypertension, benign   [place] [facility] Nancyann BRAVO, MD   2 years ago Type 2 diabetes mellitus without complication, without long-term current use of insulin (HCC)   [place] [facility] Nancyann BRAVO, MD       Future Appointments              In 1 week Fisher, Nancyann BRAVO, MD [facility]

## 2023-02-26 ENCOUNTER — Encounter: Payer: Self-pay | Admitting: Family Medicine

## 2023-02-26 DIAGNOSIS — I10 Essential (primary) hypertension: Secondary | ICD-10-CM

## 2023-02-26 DIAGNOSIS — Z125 Encounter for screening for malignant neoplasm of prostate: Secondary | ICD-10-CM

## 2023-02-26 DIAGNOSIS — E119 Type 2 diabetes mellitus without complications: Secondary | ICD-10-CM

## 2023-02-26 DIAGNOSIS — Z8673 Personal history of transient ischemic attack (TIA), and cerebral infarction without residual deficits: Secondary | ICD-10-CM

## 2023-02-27 ENCOUNTER — Encounter: Payer: Self-pay | Admitting: Family Medicine

## 2023-02-27 ENCOUNTER — Ambulatory Visit (INDEPENDENT_AMBULATORY_CARE_PROVIDER_SITE_OTHER): Payer: BC Managed Care – PPO | Admitting: Family Medicine

## 2023-02-27 VITALS — BP 134/78 | HR 93 | Resp 16 | Ht 72.0 in | Wt 257.3 lb

## 2023-02-27 DIAGNOSIS — I10 Essential (primary) hypertension: Secondary | ICD-10-CM | POA: Diagnosis not present

## 2023-02-27 DIAGNOSIS — E1169 Type 2 diabetes mellitus with other specified complication: Secondary | ICD-10-CM | POA: Diagnosis not present

## 2023-02-27 DIAGNOSIS — Z8249 Family history of ischemic heart disease and other diseases of the circulatory system: Secondary | ICD-10-CM

## 2023-02-27 DIAGNOSIS — Z23 Encounter for immunization: Secondary | ICD-10-CM | POA: Diagnosis not present

## 2023-02-27 DIAGNOSIS — Z0001 Encounter for general adult medical examination with abnormal findings: Secondary | ICD-10-CM | POA: Diagnosis not present

## 2023-02-27 DIAGNOSIS — Z0000 Encounter for general adult medical examination without abnormal findings: Secondary | ICD-10-CM

## 2023-02-27 DIAGNOSIS — E119 Type 2 diabetes mellitus without complications: Secondary | ICD-10-CM | POA: Diagnosis not present

## 2023-02-27 DIAGNOSIS — Z8673 Personal history of transient ischemic attack (TIA), and cerebral infarction without residual deficits: Secondary | ICD-10-CM | POA: Diagnosis not present

## 2023-02-27 DIAGNOSIS — Z125 Encounter for screening for malignant neoplasm of prostate: Secondary | ICD-10-CM | POA: Diagnosis not present

## 2023-02-27 DIAGNOSIS — E669 Obesity, unspecified: Secondary | ICD-10-CM

## 2023-02-27 DIAGNOSIS — Z1211 Encounter for screening for malignant neoplasm of colon: Secondary | ICD-10-CM

## 2023-02-27 NOTE — Patient Instructions (Signed)
Please review the attached list of medications and notify my office if there are any errors.    

## 2023-02-28 LAB — CBC
Hematocrit: 48.6 % (ref 37.5–51.0)
Hemoglobin: 15.9 g/dL (ref 13.0–17.7)
MCH: 30.1 pg (ref 26.6–33.0)
MCHC: 32.7 g/dL (ref 31.5–35.7)
MCV: 92 fL (ref 79–97)
Platelets: 355 10*3/uL (ref 150–450)
RBC: 5.28 x10E6/uL (ref 4.14–5.80)
RDW: 12.5 % (ref 11.6–15.4)
WBC: 8.8 10*3/uL (ref 3.4–10.8)

## 2023-02-28 LAB — LIPID PANEL
Chol/HDL Ratio: 2.9 {ratio} (ref 0.0–5.0)
Cholesterol, Total: 112 mg/dL (ref 100–199)
HDL: 39 mg/dL — ABNORMAL LOW (ref >39)
LDL Chol Calc (NIH): 53 mg/dL (ref 0–99)
Triglycerides: 110 mg/dL (ref 0–149)
VLDL Cholesterol Cal: 20 mg/dL (ref 5–40)

## 2023-02-28 LAB — COMPREHENSIVE METABOLIC PANEL
ALT: 47 [IU]/L — ABNORMAL HIGH (ref 0–44)
AST: 24 [IU]/L (ref 0–40)
Albumin: 4.7 g/dL (ref 3.8–4.9)
Alkaline Phosphatase: 75 [IU]/L (ref 44–121)
BUN/Creatinine Ratio: 20 (ref 9–20)
BUN: 21 mg/dL (ref 6–24)
Bilirubin Total: 0.4 mg/dL (ref 0.0–1.2)
CO2: 23 mmol/L (ref 20–29)
Calcium: 9.5 mg/dL (ref 8.7–10.2)
Chloride: 98 mmol/L (ref 96–106)
Creatinine, Ser: 1.07 mg/dL (ref 0.76–1.27)
Globulin, Total: 2.5 g/dL (ref 1.5–4.5)
Glucose: 153 mg/dL — ABNORMAL HIGH (ref 70–99)
Potassium: 4.3 mmol/L (ref 3.5–5.2)
Sodium: 137 mmol/L (ref 134–144)
Total Protein: 7.2 g/dL (ref 6.0–8.5)
eGFR: 83 mL/min/{1.73_m2} (ref >59)

## 2023-02-28 LAB — HEMOGLOBIN A1C
Est. average glucose Bld gHb Est-mCnc: 169 mg/dL
Hgb A1c MFr Bld: 7.5 % — ABNORMAL HIGH (ref 4.8–5.6)

## 2023-02-28 LAB — MICROALBUMIN / CREATININE URINE RATIO
Creatinine, Urine: 243.4 mg/dL
Microalb/Creat Ratio: 3 mg/g{creat} (ref 0–29)
Microalbumin, Urine: 8 ug/mL

## 2023-02-28 LAB — PSA TOTAL (REFLEX TO FREE): Prostate Specific Ag, Serum: 1.4 ng/mL (ref 0.0–4.0)

## 2023-02-28 LAB — MAGNESIUM: Magnesium: 2 mg/dL (ref 1.6–2.3)

## 2023-03-03 ENCOUNTER — Encounter: Payer: Self-pay | Admitting: Family Medicine

## 2023-03-03 NOTE — Progress Notes (Signed)
 Complete physical exam   Patient: Preston Nguyen   DOB: [birth year]   53 y.o. Male  MRN: [id number] Visit Date: 02/27/2023  Today's healthcare provider: Nancyann Perry, MD   Chief Complaint  Patient presents with   Annual Exam    Patient reports feeling well. Exercising some.   Hypertension   Hyperlipidemia   Diabetes   Subjective    Preston Nguyen is a 53 y.o. male who presents today for a complete physical exam.  He reports consuming a  regular  diet.  He generally feels well. He reports sleeping fairly well. He does not have additional problems to discuss today.   He is also here to follow up diabetes, hypertension, and hyperlipidemia. Feels well on current medications. Has titrated Ozempic  up to 2mg /dose and which he has taken two doses of with no side effects. Had labs drawn this morning. Home sugars mostly in the low 100s.     Past Medical History:  Diagnosis Date   History of chicken pox    Insomnia    Seasonal allergic rhinitis    Past Surgical History:  Procedure Laterality Date   HERNIA REPAIR  1970's   Inguinal hernia repair. when he was younger   TEE WITHOUT CARDIOVERSION N/A 06/06/2013   Procedure: TRANSESOPHAGEAL ECHOCARDIOGRAM (TEE);  Surgeon: Wilbert JONELLE Bihari, MD;  Location: [facility] ENDOSCOPY;  Service: Cardiovascular;  Laterality: N/A;   VASECTOMY  11/28/2013   WISDOM TOOTH EXTRACTION  05/2013   Social History   Socioeconomic History   Marital status: Married    Spouse name: Maire   Number of children: 1   Years of education: Masters   Highest education level: Not on file  Occupational History   Occupation: Pharmacist, Hospital  Tobacco Use   Smoking status: Never   Smokeless tobacco: Never  Vaping Use   Vaping status: Never Used  Substance and Sexual Activity   Alcohol use: Yes    Alcohol/week: 0.0 standard drinks of alcohol    Comment: occasionally   Drug use: No   Sexual activity: Not on file  Other Topics Concern   Not on file   Social History Narrative   Reports significant stress at work.   Patient is married [place]) and lives at home with his wife and his son.   Patient works full-time.   Patient has a Master's degree.   Patient is right-handed.   Patient drinks one cup of coffee daily and 3-4 sodas per day.   Social Drivers of Corporate Investment Banker Strain: Not on file  Food Insecurity: Not on file  Transportation Needs: Not on file  Physical Activity: Not on file  Stress: Not on file  Social Connections: Not on file  Intimate Partner Violence: Not on file   Family Status  Relation Name Status   Mother  Alive   MGM  Deceased   PGM  Deceased   PGF  Deceased       myeloma   Father  Deceased at age 77's       ACCIDENTAL   Sister  Alive   MGF  (Not Specified)   Neg Hx  (Not Specified)  No partnership data on file   Family History  Problem Relation Age of Onset   Coronary artery disease Mother        age 57s. had CABG   Diabetes Mother    Stroke Maternal Grandmother    Diabetes Maternal Grandmother  type 2   Hypertension Maternal Grandmother    Diabetes Paternal Grandmother    Breast cancer Paternal Grandmother    Cancer Paternal Grandfather    Heart Problems Maternal Grandfather    Colon cancer Neg Hx    Prostate cancer Neg Hx    Allergies  Allergen Reactions   Erythromycin     Possible cause of GI bleed    Patient Care Team: [place] Nancyann BRAVO, MD as PCP - General (Family Medicine)   Medications: Outpatient Medications Prior to Visit  Medication Sig   albuterol  (VENTOLIN  HFA) 108 (90 Base) MCG/ACT inhaler Inhale 2 puffs into the lungs every 6 (six) hours as needed for wheezing or shortness of breath.   aspirin  325 MG tablet Take 1 tablet (325 mg total) by mouth daily.   atorvastatin  (LIPITOR) 40 MG tablet TAKE 1 TABLET BY MOUTH EVERY DAY   fexofenadine (ALLEGRA) 180 MG tablet Take 180 mg by mouth daily as needed for allergies. Dose unkn   fluticasone  (FLONASE ) 50  MCG/ACT nasal spray Place 2 sprays into both nostrils daily.   ibuprofen (ADVIL,MOTRIN) 600 MG tablet as needed.   lisinopril -hydrochlorothiazide  (ZESTORETIC ) 10-12.5 MG tablet TAKE 1 TABLET BY MOUTH DAILY. PLEASE SCHEDULE OFFICE VISIT BEFORE ANY FUTURE REFILL   metFORMIN  (GLUCOPHAGE -XR) 500 MG 24 hr tablet TAKE 2 TABLETS (1,000 MG TOTAL) BY MOUTH EVERY EVENING.   methocarbamol  (ROBAXIN ) 500 MG tablet TAKE 1 TABLET BY MOUTH 4 TIMES DAILY AS NEEDED.   Multiple Vitamin (MULTIVITAMIN WITH MINERALS) TABS tablet Take 1 tablet by mouth daily.   pioglitazone  (ACTOS ) 30 MG tablet Take 1.5 tablets (45 mg total) by mouth daily.   Semaglutide , 2 MG/DOSE, 8 MG/3ML SOPN Inject 2 mg as directed once a week. For type 2 diabetes E11.9   tadalafil  (CIALIS ) 10 MG tablet Take 1 tablet (10 mg total) by mouth daily as needed for erectile dysfunction.   venlafaxine  XR (EFFEXOR -XR) 37.5 MG 24 hr capsule TAKE 1 CAPSULE (37.5 MG TOTAL) BY MOUTH DAILY. PLEASE SCHEDULE OFFICE VISIT BEFORE ANY FUTURE REFILL   zolpidem  (AMBIEN ) 10 MG tablet TAKE 1 TABLET BY MOUTH EVERY DAY AT BEDTIME AS NEEDED   No facility-administered medications prior to visit.    Review of Systems  Constitutional:  Negative for appetite change, chills and fever.  Respiratory:  Negative for chest tightness, shortness of breath and wheezing.   Cardiovascular:  Negative for chest pain and palpitations.  Gastrointestinal:  Negative for abdominal pain, nausea and vomiting.      Objective    BP 134/78 (BP Location: Left Arm, Patient Position: Sitting, Cuff Size: Large)   Pulse 93   Resp 16   Ht 6' (1.829 m)   Wt 257 lb 4.8 oz (116.7 kg)   BMI 34.90 kg/m    Physical Exam   General Appearance:    Obese male. Alert, cooperative, in no acute distress, appears stated age  Head:    Normocephalic, without obvious abnormality, atraumatic  Eyes:    PERRL, conjunctiva/corneas clear, EOM's intact, fundi    benign, both eyes       Ears:    Normal TM's  and external ear canals, both ears  Nose:   Nares normal, septum midline, mucosa normal, no drainage   or sinus tenderness  Throat:   Lips, mucosa, and tongue normal; teeth and gums normal  Neck:   Supple, symmetrical, trachea midline, no adenopathy;       thyroid:  No enlargement/tenderness/nodules; no carotid   bruit or JVD  Back:     Symmetric, no curvature, ROM normal, no CVA tenderness  Lungs:     Clear to auscultation bilaterally, respirations unlabored  Chest wall:    No tenderness or deformity  Heart:    Normal heart rate. Normal rhythm. No murmurs, rubs, or gallops.  S1 and S2 normal  Abdomen:     Soft, non-tender, bowel sounds active all four quadrants,    no masses, no organomegaly  Genitalia:    deferred  Rectal:    deferred  Extremities:   All extremities are intact. No cyanosis or edema  Pulses:   2+ and symmetric all extremities  Skin:   Skin color, texture, turgor normal, no rashes or lesions  Lymph nodes:   Cervical, supraclavicular, and axillary nodes normal  Neurologic:   CNII-XII intact. Normal strength, sensation and reflexes      throughout     Last depression screening scores    02/27/2023    8:24 AM 08/30/2022    8:14 AM 08/26/2021    8:43 AM  PHQ 2/9 Scores  PHQ - 2 Score 0 0 0   Last fall risk screening    02/27/2023    8:24 AM  Fall Risk   Falls in the past year? 0  Number falls in past yr: 0  Injury with Fall? 0  Risk for fall due to : No Fall Risks   Last Audit-C alcohol use screening    03/06/2022    8:48 AM  Alcohol Use Disorder Test (AUDIT)  1. How often do you have a drink containing alcohol? 2  2. How many drinks containing alcohol do you have on a typical day when you are drinking? 0  3. How often do you have six or more drinks on one occasion? 0  AUDIT-C Score 2   A score of 3 or more in women, and 4 or more in men indicates increased risk for alcohol abuse, EXCEPT if all of the points are from question 1     Assessment & Plan     Routine Health Maintenance and Physical Exam  Exercise Activities and Dietary recommendations  Goals   None     Immunization History  Administered Date(s) Administered   Influenza,inj,Quad PF,6+ Mos 10/26/2017, 11/23/2018, 11/12/2019, 10/26/2020   Influenza-Unspecified 11/14/2022   Moderna Sars-Covid-2 Vaccination 05/11/2020   PFIZER(Purple Top)SARS-COV-2 Vaccination 05/22/2019, 06/17/2019   Pneumococcal Polysaccharide-23 10/26/2017   Td 10/26/2017   Tdap 05/29/2006   Zoster Recombinant(Shingrix ) 02/27/2023    Health Maintenance  Topic Date Due   Colonoscopy  Never done   Pneumococcal Vaccine 86-23 Years old (2 of 2 - PCV) 10/27/2018   COVID-19 Vaccine (4 - 2024-25 season) 10/15/2022   Zoster Vaccines- Shingrix  (2 of 2) 04/24/2023   HEMOGLOBIN A1C  08/27/2023   OPHTHALMOLOGY EXAM  10/03/2023   Diabetic kidney evaluation - eGFR measurement  02/27/2024   Diabetic kidney evaluation - Urine ACR  02/27/2024   DTaP/Tdap/Td (3 - Td or Tdap) 10/27/2027   INFLUENZA VACCINE  Completed   Hepatitis C Screening  Completed   HIV Screening  Completed   HPV VACCINES  Aged Out    Discussed health benefits of physical activity, and encouraged him to engage in regular exercise appropriate for his age and condition.   2. Colon cancer screening  - Cologuard  3. Need for shingles vaccine  - Varicella-zoster vaccine IM  4. Primary hypertension Well controlled.  Continue current medications.    5. Type 2 diabetes mellitus without  complication, without long-term current use of insulin (HCC) Doing well with increased dose of semaglutide , but has only had two doses. A1c is pending. Anticipate reducing pioglitazone  when A1c drops below 7.0%  6. Fam hx-ischem heart disease On atorvastatin , Lipids pending.   7. Obesity (BMI 30-39.9) Anticipated weight loss if he stays on current dose of semaglutide .         Nancyann Perry, MD  [facility] [phone]  (phone) [phone] (fax)  [facility]

## 2023-03-06 ENCOUNTER — Other Ambulatory Visit: Payer: Self-pay | Admitting: Family Medicine

## 2023-03-06 DIAGNOSIS — E119 Type 2 diabetes mellitus without complications: Secondary | ICD-10-CM

## 2023-03-06 DIAGNOSIS — F430 Acute stress reaction: Secondary | ICD-10-CM

## 2023-03-06 DIAGNOSIS — I10 Essential (primary) hypertension: Secondary | ICD-10-CM

## 2023-03-13 ENCOUNTER — Other Ambulatory Visit: Payer: Self-pay | Admitting: Family Medicine

## 2023-03-13 DIAGNOSIS — Z8673 Personal history of transient ischemic attack (TIA), and cerebral infarction without residual deficits: Secondary | ICD-10-CM

## 2023-03-24 DIAGNOSIS — Z1211 Encounter for screening for malignant neoplasm of colon: Secondary | ICD-10-CM | POA: Diagnosis not present

## 2023-03-29 LAB — COLOGUARD: COLOGUARD: NEGATIVE

## 2023-03-30 ENCOUNTER — Encounter: Payer: Self-pay | Admitting: Family Medicine

## 2023-04-06 ENCOUNTER — Other Ambulatory Visit: Payer: Self-pay | Admitting: Family Medicine

## 2023-04-06 DIAGNOSIS — G4701 Insomnia due to medical condition: Secondary | ICD-10-CM

## 2023-04-06 DIAGNOSIS — F430 Acute stress reaction: Secondary | ICD-10-CM

## 2023-04-06 DIAGNOSIS — E119 Type 2 diabetes mellitus without complications: Secondary | ICD-10-CM

## 2023-04-06 DIAGNOSIS — I10 Essential (primary) hypertension: Secondary | ICD-10-CM

## 2023-04-07 MED ORDER — SEMAGLUTIDE (2 MG/DOSE) 8 MG/3ML ~~LOC~~ SOPN: 2.0000 mg | PEN_INJECTOR | SUBCUTANEOUS | 1 refills | Status: DC

## 2023-07-01 ENCOUNTER — Other Ambulatory Visit: Payer: Self-pay | Admitting: Family Medicine

## 2023-07-01 DIAGNOSIS — N529 Male erectile dysfunction, unspecified: Secondary | ICD-10-CM

## 2023-07-01 DIAGNOSIS — R351 Nocturia: Secondary | ICD-10-CM

## 2023-07-01 DIAGNOSIS — E119 Type 2 diabetes mellitus without complications: Secondary | ICD-10-CM

## 2023-08-06 ENCOUNTER — Other Ambulatory Visit: Payer: Self-pay | Admitting: Family Medicine

## 2023-08-06 DIAGNOSIS — E119 Type 2 diabetes mellitus without complications: Secondary | ICD-10-CM

## 2023-08-26 ENCOUNTER — Other Ambulatory Visit: Payer: Self-pay | Admitting: Family Medicine

## 2023-08-26 DIAGNOSIS — E119 Type 2 diabetes mellitus without complications: Secondary | ICD-10-CM

## 2023-08-27 ENCOUNTER — Telehealth: Payer: Self-pay

## 2023-08-27 DIAGNOSIS — E119 Type 2 diabetes mellitus without complications: Secondary | ICD-10-CM

## 2023-08-27 NOTE — Telephone Encounter (Signed)
 Copied from CRM [id number]. Topic: Appointments - Appointment Scheduling >> Aug 27, 2023  2:49 PM Marissa P wrote: Patient/patient representative is calling to schedule an appointment. Refer to attachments for appointment information.  [employer] patient needs orders sent in there please to have his labs same day please

## 2023-08-28 NOTE — Telephone Encounter (Signed)
 LVM with the below message from provider, stated if he has any questions to please give us  a call back.

## 2023-08-28 NOTE — Telephone Encounter (Signed)
 Spoke with pt and made aware of only test need is A1c and has been placed. Pt verbalized understanding.

## 2023-08-28 NOTE — Telephone Encounter (Signed)
 Only lab he needs is A1c. Have placed order, but if he has this done at [facility] he will need to go at least 1 day before his appointment so we have result.

## 2023-09-30 ENCOUNTER — Other Ambulatory Visit: Payer: Self-pay | Admitting: Family Medicine

## 2023-09-30 DIAGNOSIS — E119 Type 2 diabetes mellitus without complications: Secondary | ICD-10-CM

## 2023-10-02 DIAGNOSIS — E119 Type 2 diabetes mellitus without complications: Secondary | ICD-10-CM | POA: Diagnosis not present

## 2023-10-03 ENCOUNTER — Ambulatory Visit: Payer: Self-pay | Admitting: Family Medicine

## 2023-10-03 ENCOUNTER — Encounter: Payer: Self-pay | Admitting: Family Medicine

## 2023-10-03 VITALS — BP 122/83 | HR 106 | Ht 72.0 in | Wt 251.0 lb

## 2023-10-03 DIAGNOSIS — E119 Type 2 diabetes mellitus without complications: Secondary | ICD-10-CM

## 2023-10-03 DIAGNOSIS — I10 Essential (primary) hypertension: Secondary | ICD-10-CM

## 2023-10-03 DIAGNOSIS — G4701 Insomnia due to medical condition: Secondary | ICD-10-CM | POA: Diagnosis not present

## 2023-10-03 DIAGNOSIS — Z7984 Long term (current) use of oral hypoglycemic drugs: Secondary | ICD-10-CM

## 2023-10-03 LAB — HEMOGLOBIN A1C
Est. average glucose Bld gHb Est-mCnc: 157 mg/dL
Hgb A1c MFr Bld: 7.1 % — ABNORMAL HIGH (ref 4.8–5.6)

## 2023-10-03 MED ORDER — TIRZEPATIDE 7.5 MG/0.5ML ~~LOC~~ SOAJ: 7.5000 mg | SUBCUTANEOUS | 0 refills | Status: DC

## 2023-10-03 MED ORDER — ZOLPIDEM TARTRATE 10 MG PO TABS: ORAL_TABLET | ORAL | 1 refills | Status: DC

## 2023-10-03 MED ORDER — PIOGLITAZONE HCL 45 MG PO TABS: 45.0000 mg | ORAL_TABLET | Freq: Every day | ORAL | 1 refills | Status: AC

## 2023-10-03 NOTE — Progress Notes (Signed)
 Established patient visit   Patient: Preston Nguyen   DOB: Jul 17, 1970   53 y.o. Male  MRN: [id number] Visit Date: 10/03/2023  Today's healthcare provider: Nancyann Perry, MD   Chief Complaint  Patient presents with   Diabetes   Subjective    Discussed the use of AI scribe software for clinical note transcription with the patient, who gave verbal consent to proceed.  History of Present Illness   DMARIUS Nguyen is a 53 year old male with type 2 diabetes , insomnia and hypertension who presents for a follow-up visit.  His hemoglobin A1c level was 7.1% yesterday; it was 7.5% in January. He attributes this improvement to increased physical activity and the use of Ozempic . He has been taking 2 mg of Ozempic  and reports no significant side effects, although he occasionally experiences increased bowel movements, which he suspects might be related to the medication. No nausea, constipation, or abdominal pain.   He is also taking metformin  and pioglitazone  for diabetes management. He takes two tablets of pioglitazone  one day and one the next, due to a previous shortage of the 45 mg tablets. He recalls being on the 45 mg dose previously. He is interested in trying something more effective than Ozempic  for better control of sugar and to help lose weight.   He has been using Ambien  nightly for over ten years to aid sleep, taking it almost every night. It helps him sleep for about five to six hours, although he sometimes wakes up in the middle of the night. He does not feel groggy in the morning and notices a difference when he does not take it.  His blood pressure was noted to be high at a recent dental visit, but he attributes this to anxiety related to dental procedures, as he has experienced similar issues in the past.     BP Readings from Last 3 Encounters:  10/03/23 122/83  02/27/23 134/78  08/30/22 123/80   Wt Readings from Last 3 Encounters:  10/03/23 251 lb (113.9 kg)   02/27/23 257 lb 4.8 oz (116.7 kg)  08/30/22 254 lb (115.2 kg)   Lab Results  Component Value Date   NA 137 02/27/2023   K 4.3 02/27/2023   CREATININE 1.07 02/27/2023   EGFR 83 02/27/2023   GLUCOSE 153 (H) 02/27/2023     Medications: Outpatient Medications Prior to Visit  Medication Sig   albuterol  (VENTOLIN  HFA) 108 (90 Base) MCG/ACT inhaler Inhale 2 puffs into the lungs every 6 (six) hours as needed for wheezing or shortness of breath.   aspirin  325 MG tablet Take 1 tablet (325 mg total) by mouth daily.   atorvastatin  (LIPITOR) 40 MG tablet TAKE 1 TABLET BY MOUTH EVERY DAY   fexofenadine (ALLEGRA) 180 MG tablet Take 180 mg by mouth daily as needed for allergies. Dose unkn   fluticasone  (FLONASE ) 50 MCG/ACT nasal spray Place 2 sprays into both nostrils daily.   ibuprofen (ADVIL,MOTRIN) 600 MG tablet as needed.   lisinopril -hydrochlorothiazide  (ZESTORETIC ) 10-12.5 MG tablet Take 1 tablet by mouth daily.   metFORMIN  (GLUCOPHAGE -XR) 500 MG 24 hr tablet TAKE 2 TABLETS (1,000 MG TOTAL) BY MOUTH EVERY EVENING.   methocarbamol  (ROBAXIN ) 500 MG tablet TAKE 1 TABLET BY MOUTH 4 TIMES DAILY AS NEEDED.   Multiple Vitamin (MULTIVITAMIN WITH MINERALS) TABS tablet Take 1 tablet by mouth daily.   Semaglutide , 2 MG/DOSE, (OZEMPIC , 2 MG/DOSE,) 8 MG/3ML SOPN INJECT 2 MG AS DIRECTED ONCE A WEEK. FOR TYPE 2  DIABETES E11.9   tadalafil  (CIALIS ) 10 MG tablet TAKE 1 TABLET BY MOUTH DAILY AS NEEDED FOR ERECTILE DYSFUNCTION   venlafaxine  XR (EFFEXOR -XR) 37.5 MG 24 hr capsule Take 1 capsule (37.5 mg total) by mouth daily.   pioglitazone  (ACTOS ) 30 MG tablet TAKE 1.5 TABLETS BY MOUTH DAILY.   zolpidem  (AMBIEN ) 10 MG tablet TAKE 1 TABLET BY MOUTH EVERY DAY AT BEDTIME AS NEEDED   No facility-administered medications prior to visit.   Review of Systems  Constitutional:  Negative for appetite change, chills and fever.  Respiratory:  Negative for chest tightness, shortness of breath and wheezing.    Cardiovascular:  Negative for chest pain and palpitations.  Gastrointestinal:  Negative for abdominal pain, nausea and vomiting.       Objective    BP 122/83   Pulse (!) 106   Ht 6' (1.829 m)   Wt 251 lb (113.9 kg)   SpO2 98%   BMI 34.04 kg/m   Physical Exam   General appearance: Obese male, cooperative and in no acute distress Head: Normocephalic, without obvious abnormality, atraumatic Respiratory: Respirations even and unlabored, normal respiratory rate Extremities: All extremities are intact.  Skin: Skin color, texture, turgor normal. No rashes seen  Psych: Appropriate mood and affect. Neurologic: Mental status: Alert, oriented to person, place, and time, thought content appropriate.    Assessment & Plan        Type 2 diabetes mellitus Improved glycemic control with decreased Hemoglobin A1c from 7.5% to 7.1%. Increased bowel movements possibly due to Ozempic . - Continue metformin  and pioglitazone . - Switch pioglitazone  to 45 mg daily. - Continue Ozempic  until supply is exhausted. - Prescribe Mounjaro  to start after Ozempic . Mounjaro  may aid weight loss and offers similar side effects, affecting GLP-1 and GIT for better metabolic control. - Schedule follow-up in January for yearly blood work and to assess response to Mounjaro  if switched. - Advise monitoring blood glucose levels at home, especially after switching medications.  Insomnia Chronic insomnia managed with Ambien , taken nightly for over ten years with improved sleep quality. No morning grogginess, but sleep duration limited to 5-6 hours. - Refill Ambien  prescription.   Primary hypertension (Primary) BP near goal. Continue current medications.        Nancyann Perry, MD  [facility] [phone] (phone) [phone] (fax)  [facility]

## 2023-10-03 NOTE — Patient Instructions (Signed)
 [name]  Please review the attached list of medications and notify my office if there are any errors.   . Please bring all of your medications to every appointment so we can make sure that our medication list is the same as yours.

## 2023-10-19 ENCOUNTER — Other Ambulatory Visit: Payer: Self-pay | Admitting: Family Medicine

## 2023-10-19 DIAGNOSIS — G8929 Other chronic pain: Secondary | ICD-10-CM

## 2023-11-17 ENCOUNTER — Other Ambulatory Visit: Payer: Self-pay | Admitting: Family Medicine

## 2023-11-17 DIAGNOSIS — E119 Type 2 diabetes mellitus without complications: Secondary | ICD-10-CM

## 2023-11-19 NOTE — Telephone Encounter (Signed)
 Requested medication (s) are due for refill today: yes  Requested medication (s) are on the active medication list: yes  Last refill:  08/27/23 9 mll  Future visit scheduled: yes   Notes to clinic:  Is pt going to switch to Mounjaro ?    Requested Prescriptions  Pending Prescriptions Disp Refills   OZEMPIC , 2 MG/DOSE, 8 MG/3ML SOPN [Pharmacy Med Name: OZEMPIC  8 MG/3 ML (2 MG/DOSE)]      Sig: INJECT 2 MG SUBCUTANEOUSLY AS DIRECTED ONCE EACH WEEK. *E11.9*     Endocrinology:  Diabetes - GLP-1 Receptor Agonists - semaglutide  Failed - 11/19/2023  3:28 PM      Failed - HBA1C in normal range and within 180 days    Hemoglobin A1C  Date Value Ref Range Status  09/19/2016 6.0  Final   Hgb A1c MFr Bld  Date Value Ref Range Status  10/02/2023 7.1 (H) 4.8 - 5.6 % Final    Comment:             Prediabetes: 5.7 - 6.4          Diabetes: >6.4          Glycemic control for adults with diabetes: <7.0          Passed - Cr in normal range and within 360 days    Creatinine  Date Value Ref Range Status  06/04/2013 1.17 0.60 - 1.30 mg/dL Final   Creatinine, Ser  Date Value Ref Range Status  02/27/2023 1.07 0.76 - 1.27 mg/dL Final         Passed - Valid encounter within last 6 months    Recent Outpatient Visits           1 month ago Primary hypertension   [facility] Gasper Nancyann BRAVO, MD       Future Appointments             In 3 months Fisher, Nancyann BRAVO, MD [facility], [place]

## 2023-11-25 ENCOUNTER — Other Ambulatory Visit: Payer: Self-pay | Admitting: Family Medicine

## 2023-11-25 DIAGNOSIS — G8929 Other chronic pain: Secondary | ICD-10-CM

## 2024-01-04 ENCOUNTER — Other Ambulatory Visit: Payer: Self-pay | Admitting: Family Medicine

## 2024-01-04 DIAGNOSIS — N529 Male erectile dysfunction, unspecified: Secondary | ICD-10-CM

## 2024-01-04 DIAGNOSIS — R351 Nocturia: Secondary | ICD-10-CM

## 2024-02-02 ENCOUNTER — Other Ambulatory Visit: Payer: Self-pay | Admitting: Family Medicine

## 2024-02-02 DIAGNOSIS — E119 Type 2 diabetes mellitus without complications: Secondary | ICD-10-CM

## 2024-02-13 ENCOUNTER — Other Ambulatory Visit: Payer: Self-pay | Admitting: Family Medicine

## 2024-02-13 DIAGNOSIS — E119 Type 2 diabetes mellitus without complications: Secondary | ICD-10-CM

## 2024-02-26 ENCOUNTER — Other Ambulatory Visit: Payer: Self-pay | Admitting: Family Medicine

## 2024-02-26 DIAGNOSIS — E119 Type 2 diabetes mellitus without complications: Secondary | ICD-10-CM

## 2024-03-03 ENCOUNTER — Telehealth: Payer: Self-pay

## 2024-03-03 DIAGNOSIS — I10 Essential (primary) hypertension: Secondary | ICD-10-CM

## 2024-03-03 DIAGNOSIS — E669 Obesity, unspecified: Secondary | ICD-10-CM

## 2024-03-03 DIAGNOSIS — E119 Type 2 diabetes mellitus without complications: Secondary | ICD-10-CM

## 2024-03-03 DIAGNOSIS — Z125 Encounter for screening for malignant neoplasm of prostate: Secondary | ICD-10-CM

## 2024-03-03 NOTE — Telephone Encounter (Signed)
 Order printed

## 2024-03-03 NOTE — Telephone Encounter (Signed)
 Copied from CRM [id number]. Topic: Clinical - Request for Lab/Test Order >> Mar 03, 2024 11:03 AM Wyona SQUIBB wrote: Reason for CRM: Pt calling to request lab work order for upcoming physical on Wednesday 03/05/2024   Pls reach out pt when order is in.

## 2024-03-04 ENCOUNTER — Other Ambulatory Visit: Payer: Self-pay | Admitting: Family Medicine

## 2024-03-04 DIAGNOSIS — E119 Type 2 diabetes mellitus without complications: Secondary | ICD-10-CM

## 2024-03-05 ENCOUNTER — Encounter: Payer: Self-pay | Admitting: Family Medicine

## 2024-03-05 ENCOUNTER — Ambulatory Visit (INDEPENDENT_AMBULATORY_CARE_PROVIDER_SITE_OTHER): Admitting: Family Medicine

## 2024-03-05 VITALS — BP 126/85 | HR 85 | Resp 16 | Ht 72.0 in | Wt 250.0 lb

## 2024-03-05 DIAGNOSIS — Z0000 Encounter for general adult medical examination without abnormal findings: Secondary | ICD-10-CM

## 2024-03-05 DIAGNOSIS — Z23 Encounter for immunization: Secondary | ICD-10-CM

## 2024-03-05 DIAGNOSIS — Z0001 Encounter for general adult medical examination with abnormal findings: Secondary | ICD-10-CM | POA: Diagnosis not present

## 2024-03-05 DIAGNOSIS — F5101 Primary insomnia: Secondary | ICD-10-CM

## 2024-03-05 DIAGNOSIS — Z7985 Long-term (current) use of injectable non-insulin antidiabetic drugs: Secondary | ICD-10-CM

## 2024-03-05 DIAGNOSIS — E119 Type 2 diabetes mellitus without complications: Secondary | ICD-10-CM

## 2024-03-05 MED ORDER — ZOLPIDEM TARTRATE ER 12.5 MG PO TBCR: 12.5000 mg | EXTENDED_RELEASE_TABLET | Freq: Every evening | ORAL | 1 refills | Status: AC | PRN

## 2024-03-05 NOTE — Progress Notes (Signed)
 "    Complete physical exam   Patient: Preston Nguyen   DOB: [birth year]   54 y.o. Male  MRN: [id number] Visit Date: 03/05/2024  Today's healthcare provider: Nancyann Perry, MD   Chief Complaint  Patient presents with   Annual Exam    CPE.SABRANo other concerns   Subjective    Discussed the use of AI scribe software for clinical note transcription with the patient, who gave verbal consent to proceed.  History of Present Illness   Preston Nguyen is a 54 year old male who presents for a routine physical exam.  He has a history of hypertension, type 2 diabetes, and obstructive sleep apnea. He is currently taking Mounjaro  and pioglitazone  for diabetes management. There was a mix-up with his pioglitazone  prescription, leading him to take two 30 mg tablets instead of one 45 mg tablet. He also takes metformin  at night. He has not been checking his blood sugar frequently at home but feels well overall. He has been more active, going to the gym and focusing on weight lifting, though he acknowledges the need to incorporate more cardio.  He takes Ambien  almost daily for sleep, usually around 10 PM, but finds it wears off around 3 AM. He is interested in exploring other options for sleep medication.  He has received the flu vaccine at work a couple of months ago and has had the pneumonia and shingles vaccines in the past. He is unsure if he received the second dose of the shingles vaccine. He had the hepatitis B vaccine approximately 15 years ago as part of his employment.  He experiences occasional coughing spells, which he attributes to caffeine intake. He denies any history of smoking and reports no shortness of breath. He notes a potential decline in vision and plans to schedule an eye exam.  He reports no trouble with hearing, ringing in the ears, shortness of breath, numbness, or tingling in the fingers or toes.     Lab Results  Component Value Date   HGBA1C 7.1 (H) 10/02/2023    HGBA1C 7.5 (H) 02/27/2023   HGBA1C 6.6 (A) 08/30/2022   Lab Results  Component Value Date   NA 137 02/27/2023   K 4.3 02/27/2023   CREATININE 1.07 02/27/2023   EGFR 83 02/27/2023   GLUCOSE 153 (H) 02/27/2023   Wt Readings from Last 3 Encounters:  03/05/24 250 lb (113.4 kg)  10/03/23 251 lb (113.9 kg)  02/27/23 257 lb 4.8 oz (116.7 kg)       Past Medical History:  Diagnosis Date   History of chicken pox    Insomnia    Seasonal allergic rhinitis    Past Surgical History:  Procedure Laterality Date   HERNIA REPAIR  1970's   Inguinal hernia repair. when he was younger   TEE WITHOUT CARDIOVERSION N/A 06/06/2013   Procedure: TRANSESOPHAGEAL ECHOCARDIOGRAM (TEE);  Surgeon: Wilbert JONELLE Bihari, MD;  Location: [facility] END[facility];  Service: Cardiovascular;  Laterality: N/A;   VASECTOMY  11/28/2013   WISDOM TOOTH EXTRACTION  05/2013   Social History   Socioeconomic History   Marital status: Married    Spouse name: Maire   Number of children: 1   Years of education: Masters   Highest education level: Not on file  Occupational History   Occupation: Pharmacist, Hospital  Tobacco Use   Smoking status: Never   Smokeless tobacco: Never  Vaping Use   Vaping status: Never Used  Substance and Sexual Activity   Alcohol use: Yes  Alcohol/week: 0.0 standard drinks of alcohol    Comment: occasionally   Drug use: No   Sexual activity: Not on file  Other Topics Concern   Not on file  Social History Narrative   Reports significant stress at work.   Patient is married Peyton) and lives at home with his wife and his son.   Patient works full-time.   Patient has a Master's degree.   Patient is right-handed.   Patient drinks one cup of coffee daily and 3-4 sodas per day.   Social Drivers of Health   Tobacco Use: Low Risk (03/05/2024)   Patient History    Smoking Tobacco Use: Never    Smokeless Tobacco Use: Never    Passive Exposure: Not on file  Financial Resource Strain: Low Risk  (10/03/2023)   Overall Financial Resource Strain (CARDIA)    Difficulty of Paying Living Expenses: Not hard at all  Food Insecurity: No Food Insecurity (10/03/2023)   Epic    Worried About Radiation Protection Practitioner of Food in the Last Year: Never true    Ran Out of Food in the Last Year: Never true  Transportation Needs: No Transportation Needs (10/03/2023)   Epic    Lack of Transportation (Medical): No    Lack of Transportation (Non-Medical): No  Physical Activity: Not on file  Stress: Not on file  Social Connections: Not on file  Intimate Partner Violence: Not At Risk (10/03/2023)   Epic    Fear of Current or Ex-Partner: No    Emotionally Abused: No    Physically Abused: No    Sexually Abused: No  Depression (PHQ2-9): Low Risk (02/27/2023)   Depression (PHQ2-9)    PHQ-2 Score: 0  Alcohol Screen: Low Risk (10/03/2023)   Alcohol Screen    Last Alcohol Screening Score (AUDIT): 2  Housing: Unknown (10/03/2023)   Epic    Unable to Pay for Housing in the Last Year: No    Number of Times Moved in the Last Year: Not on file    Homeless in the Last Year: No  Utilities: Not At Risk (10/03/2023)   Epic    Threatened with loss of utilities: No  Health Literacy: Adequate Health Literacy (10/03/2023)   B1300 Health Literacy    Frequency of need for help with medical instructions: Never   Family Status  Relation Name Status   Mother  Deceased   Father  Deceased at age 88's       ACCIDENTAL   Sister  Alive   MGM  Deceased   MGF  (Not Specified)   PGM  Deceased   PGF  Deceased       myeloma   Neg Hx  (Not Specified)  No partnership data on file   Family History  Problem Relation Age of Onset   Coronary artery disease Mother        age 16s. had CABG   Diabetes Mother    Stroke Maternal Grandmother    Diabetes Maternal Grandmother        type 2   Hypertension Maternal Grandmother    Heart Problems Maternal Grandfather    Diabetes Paternal Grandmother    Breast cancer Paternal Grandmother     Cancer Paternal Grandfather    Colon cancer Neg Hx    Prostate cancer Neg Hx    Allergies[1]  Patient Care Team: Gasper Nancyann BRAVO, MD as PCP - General (Family Medicine)   Medications: Show/hide medication list[2]  Review of Systems  Constitutional:  Negative for  appetite change, chills and fever.  Respiratory:  Negative for chest tightness, shortness of breath and wheezing.   Cardiovascular:  Negative for chest pain and palpitations.  Gastrointestinal:  Negative for abdominal pain, nausea and vomiting.      Objective    BP 126/85 (BP Location: Left Arm, Patient Position: Sitting, Cuff Size: Large)   Pulse 85   Resp 16   Ht 6' (1.829 m)   Wt 250 lb (113.4 kg)   SpO2 99%   BMI 33.91 kg/m    Physical Exam  General Appearance:    Mildly obese male. Alert, cooperative, in no acute distress, appears stated age  Head:    Normocephalic, without obvious abnormality, atraumatic  Eyes:    PERRL, conjunctiva/corneas clear, EOM's intact, fundi    benign, both eyes       Ears:    Normal TM's and external ear canals, both ears  Nose:   Nares normal, septum midline, mucosa normal, no drainage   or sinus tenderness  Throat:   Lips, mucosa, and tongue normal; teeth and gums normal  Neck:   Supple, symmetrical, trachea midline, no adenopathy;       thyroid:  No enlargement/tenderness/nodules; no carotid   bruit or JVD  Back:     Symmetric, no curvature, ROM normal, no CVA tenderness  Lungs:     Clear to auscultation bilaterally, respirations unlabored  Chest wall:    No tenderness or deformity  Heart:    Normal heart rate. Normal rhythm. No murmurs, rubs, or gallops.  S1 and S2 normal  Abdomen:     Soft, non-tender, bowel sounds active all four quadrants,    no masses, no organomegaly  Genitalia:    deferred  Rectal:    deferred  Extremities:   All extremities are intact. No cyanosis or edema  Pulses:   2+ and symmetric all extremities  Skin:   Skin color, texture, turgor normal,  no rashes or lesions  Lymph nodes:   Cervical, supraclavicular, and axillary nodes normal  Neurologic:   CNII-XII intact. Normal strength, sensation and reflexes      throughout       Last depression screening scores    02/27/2023    8:24 AM 08/30/2022    8:14 AM 08/26/2021    8:43 AM  PHQ 2/9 Scores  PHQ - 2 Score 0 0 0   Last fall risk screening    02/27/2023    8:24 AM  Fall Risk   Falls in the past year? 0  Number falls in past yr: 0  Injury with Fall? 0   Risk for fall due to : No Fall Risks     Data saved with a previous flowsheet row definition   Last Audit-C alcohol use screening    10/03/2023    8:36 AM  Alcohol Use Disorder Test (AUDIT)  1. How often do you have a drink containing alcohol? 2  2. How many drinks containing alcohol do you have on a typical day when you are drinking? 0  3. How often do you have six or more drinks on one occasion? 0  AUDIT-C Score 2   A score of 3 or more in women, and 4 or more in men indicates increased risk for alcohol abuse, EXCEPT if all of the points are from question 1   No results found for any visits on 03/05/24.  Assessment & Plan    Routine Health Maintenance and Physical Exam  Exercise Activities and  Dietary recommendations  Goals   None     Immunization History  Administered Date(s) Administered   Influenza, Mdck, Trivalent,PF 6+ MOS(egg free) 11/18/2023   Influenza,inj,Quad PF,6+ Mos 10/26/2017, 11/23/2018, 11/12/2019, 10/26/2020   Influenza-Unspecified 11/20/2022   Moderna Sars-Covid-2 Vaccination 05/11/2020   PFIZER(Purple Top)SARS-COV-2 Vaccination 05/22/2019, 06/17/2019   PNEUMOCOCCAL CONJUGATE-20 03/05/2024   Pneumococcal Polysaccharide-23 10/26/2017   Td 10/26/2017   Tdap 05/29/2006   Zoster Recombinant(Shingrix ) 02/27/2023    Health Maintenance  Topic Date Due   Hepatitis B Vaccines 19-59 Average Risk (1 of 3 - 19+ 3-dose series) Never done   Zoster Vaccines- Shingrix  (2 of 2) 04/24/2023    OPHTHALMOLOGY EXAM  10/03/2023   COVID-19 Vaccine (4 - 2025-26 season) 10/15/2023   Diabetic kidney evaluation - eGFR measurement  02/27/2024   Diabetic kidney evaluation - Urine ACR  02/27/2024   HEMOGLOBIN A1C  04/03/2024   Fecal DNA (Cologuard)  03/23/2026   DTaP/Tdap/Td (3 - Td or Tdap) 10/27/2027   Pneumococcal Vaccine: 50+ Years  Completed   Influenza Vaccine  Completed   HPV VACCINES (No Doses Required) Completed   Hepatitis C Screening  Completed   HIV Screening  Completed   Meningococcal B Vaccine  Aged Out    Discussed health benefits of physical activity, and encouraged him to engage in regular exercise appropriate for his age and condition.     Annual physical examination and preventive care Routine exam completed. Discussed weight-bearing exercises for muscle mass maintenance with Mounjaro  use. - Continue annual physical exams. - Encouraged weight-bearing exercises.  Type 2 diabetes mellitus Managed with Mounjaro  and pioglitazone . Awaiting lab results to assess pioglitazone  need. The patient was questioned about his glucose monitoring habits, and it was mentioned that he hasn't checked his sugar levels a whole lot lately. The patient mentioned that he uses a machine at home, but only occasionally checks his sugar levels. - Continue Mounjaro . - Review lab results for pioglitazone  need.  Chronic insomnia Managed with Ambien . Discussed extended-release Ambien  for prolonged sleep without morning grogginess. - change to extended-release Ambien  12.5mg  hs  Immunization: pneumococcal and hepatitis B vaccination Received flu vaccine. Uncertain about shingles vaccine; advised verification. Received Pneumovax; recommended Prevnar. Advised checking hepatitis B titer. - Verify shingles vaccine status. - Administered Prevnar. - Checked hepatitis B titer. If able  to add to labs drawn this morning.          Nancyann Perry, MD  [facility] [phone] (phone) [phone] (fax)  [facility]    [1]  Allergies Allergen Reactions   Erythromycin     Possible cause of GI bleed  [2]  Outpatient Medications Prior to Visit  Medication Sig Note   albuterol  (VENTOLIN  HFA) 108 (90 Base) MCG/ACT inhaler Inhale 2 puffs into the lungs every 6 (six) hours as needed for wheezing or shortness of breath.    aspirin  325 MG tablet Take 1 tablet (325 mg total) by mouth daily.    atorvastatin  (LIPITOR) 40 MG tablet TAKE 1 TABLET BY MOUTH EVERY DAY    fexofenadine (ALLEGRA) 180 MG tablet Take 180 mg by mouth daily as needed for allergies. Dose unkn    fluticasone  (FLONASE ) 50 MCG/ACT nasal spray Place 2 sprays into both nostrils daily.    ibuprofen (ADVIL,MOTRIN) 600 MG tablet as needed.    lisinopril -hydrochlorothiazide  (ZESTORETIC ) 10-12.5 MG tablet Take 1 tablet by mouth daily.    metFORMIN  (GLUCOPHAGE -XR) 500 MG 24 hr tablet TAKE 2 TABLETS (1,000 MG TOTAL) BY  MOUTH EVERY EVENING.    methocarbamol  (ROBAXIN ) 500 MG tablet TAKE 1 TABLET BY MOUTH 4 TIMES DAILY AS NEEDED.    Multiple Vitamin (MULTIVITAMIN WITH MINERALS) TABS tablet Take 1 tablet by mouth daily.    pioglitazone  (ACTOS ) 30 MG tablet TAKE 1.5 TABLETS BY MOUTH DAILY.    pioglitazone  (ACTOS ) 45 MG tablet Take 1 tablet (45 mg total) by mouth daily.    tadalafil  (CIALIS ) 10 MG tablet TAKE 1 TABLET BY MOUTH DAILY AS NEEDED FOR ERECTILE DYSFUNCTION    tirzepatide  (MOUNJARO ) 7.5 MG/0.5ML Pen INJECT 7.5 MG INTO THE SKIN ONCE A WEEK    venlafaxine  XR (EFFEXOR -XR) 37.5 MG 24 hr capsule Take 1 capsule (37.5 mg total) by mouth daily.    [DISCONTINUED] zolpidem  (AMBIEN ) 10 MG tablet TAKE 1 TABLET BY MOUTH EVERY DAY AT BEDTIME AS NEEDED 03/05/2024: change to CR   No facility-administered medications prior to visit.   "

## 2024-03-06 LAB — COMPREHENSIVE METABOLIC PANEL WITH GFR
ALT: 43 IU/L (ref 0–44)
AST: 22 IU/L (ref 0–40)
Albumin: 4.9 g/dL (ref 3.8–4.9)
Alkaline Phosphatase: 72 IU/L (ref 47–123)
BUN/Creatinine Ratio: 22 — ABNORMAL HIGH (ref 9–20)
BUN: 20 mg/dL (ref 6–24)
Bilirubin Total: 0.5 mg/dL (ref 0.0–1.2)
CO2: 23 mmol/L (ref 20–29)
Calcium: 9.8 mg/dL (ref 8.7–10.2)
Chloride: 100 mmol/L (ref 96–106)
Creatinine, Ser: 0.93 mg/dL (ref 0.76–1.27)
Globulin, Total: 2.6 g/dL (ref 1.5–4.5)
Glucose: 135 mg/dL — ABNORMAL HIGH (ref 70–99)
Potassium: 4.5 mmol/L (ref 3.5–5.2)
Sodium: 140 mmol/L (ref 134–144)
Total Protein: 7.5 g/dL (ref 6.0–8.5)
eGFR: 98 mL/min/1.73

## 2024-03-06 LAB — LIPID PANEL
Chol/HDL Ratio: 2.7 ratio (ref 0.0–5.0)
Cholesterol, Total: 112 mg/dL (ref 100–199)
HDL: 42 mg/dL
LDL Chol Calc (NIH): 53 mg/dL (ref 0–99)
Triglycerides: 89 mg/dL (ref 0–149)
VLDL Cholesterol Cal: 17 mg/dL (ref 5–40)

## 2024-03-06 LAB — CBC
Hematocrit: 48.9 % (ref 37.5–51.0)
Hemoglobin: 15.8 g/dL (ref 13.0–17.7)
MCH: 29.6 pg (ref 26.6–33.0)
MCHC: 32.3 g/dL (ref 31.5–35.7)
MCV: 92 fL (ref 79–97)
Platelets: 350 x10E3/uL (ref 150–450)
RBC: 5.33 x10E6/uL (ref 4.14–5.80)
RDW: 12.6 % (ref 11.6–15.4)
WBC: 7.9 x10E3/uL (ref 3.4–10.8)

## 2024-03-06 LAB — MICROALBUMIN / CREATININE URINE RATIO
Creatinine, Urine: 197.2 mg/dL
Microalb/Creat Ratio: 4 mg/g{creat} (ref 0–29)
Microalbumin, Urine: 7.4 ug/mL

## 2024-03-06 LAB — HEMOGLOBIN A1C
Est. average glucose Bld gHb Est-mCnc: 163 mg/dL
Hgb A1c MFr Bld: 7.3 % — ABNORMAL HIGH (ref 4.8–5.6)

## 2024-03-06 LAB — PSA TOTAL (REFLEX TO FREE): Prostate Specific Ag, Serum: 1.7 ng/mL (ref 0.0–4.0)

## 2024-03-06 LAB — TSH: TSH: 2.44 u[IU]/mL (ref 0.450–4.500)

## 2024-03-07 ENCOUNTER — Ambulatory Visit: Payer: Self-pay | Admitting: Family Medicine

## 2024-03-16 ENCOUNTER — Encounter: Payer: Self-pay | Admitting: Family Medicine

## 2024-09-03 ENCOUNTER — Ambulatory Visit: Admitting: Family Medicine
